# Patient Record
Sex: Female | Born: 1965 | Hispanic: No | Marital: Married | Smoking: Former smoker
Health system: Southern US, Community
[De-identification: ages and names within clinical notes are randomized; demographics above are authoritative.]

## PROBLEM LIST (undated history)

## (undated) HISTORY — PX: BREAST BIOPSY: SHX20

---

## 2009-09-13 ENCOUNTER — Ambulatory Visit: Payer: Self-pay | Admitting: Family Medicine

## 2014-11-13 ENCOUNTER — Emergency Department
Admission: EM | Admit: 2014-11-13 | Discharge: 2014-11-13 | Disposition: A | Discharge status: Home / Self Care | Payer: Medicare Other | Attending: Student | Admitting: Student

## 2014-11-13 ENCOUNTER — Encounter: Payer: Self-pay | Admitting: Emergency Medicine

## 2014-11-13 ENCOUNTER — Emergency Department: Payer: Medicare Other

## 2014-11-13 DIAGNOSIS — R112 Nausea with vomiting, unspecified: Secondary | ICD-10-CM | POA: Insufficient documentation

## 2014-11-13 DIAGNOSIS — Z3202 Encounter for pregnancy test, result negative: Secondary | ICD-10-CM | POA: Diagnosis not present

## 2014-11-13 DIAGNOSIS — N201 Calculus of ureter: Secondary | ICD-10-CM | POA: Diagnosis not present

## 2014-11-13 DIAGNOSIS — N200 Calculus of kidney: Secondary | ICD-10-CM | POA: Insufficient documentation

## 2014-11-13 DIAGNOSIS — R109 Unspecified abdominal pain: Secondary | ICD-10-CM | POA: Diagnosis not present

## 2014-11-13 DIAGNOSIS — D72829 Elevated white blood cell count, unspecified: Secondary | ICD-10-CM | POA: Diagnosis not present

## 2014-11-13 DIAGNOSIS — R1084 Generalized abdominal pain: Secondary | ICD-10-CM | POA: Diagnosis present

## 2014-11-13 DIAGNOSIS — R1111 Vomiting without nausea: Secondary | ICD-10-CM

## 2014-11-13 LAB — COMPREHENSIVE METABOLIC PANEL
ALBUMIN: 4.1 g/dL (ref 3.5–5.0)
ALT: 12 U/L — ABNORMAL LOW (ref 14–54)
AST: 18 U/L (ref 15–41)
Alkaline Phosphatase: 58 U/L (ref 38–126)
Anion gap: 9 (ref 5–15)
BILIRUBIN TOTAL: 0.6 mg/dL (ref 0.3–1.2)
BUN: 21 mg/dL — ABNORMAL HIGH (ref 6–20)
CHLORIDE: 111 mmol/L (ref 101–111)
CO2: 20 mmol/L — ABNORMAL LOW (ref 22–32)
CREATININE: 1.27 mg/dL — AB (ref 0.44–1.00)
Calcium: 9.2 mg/dL (ref 8.9–10.3)
GFR calc Af Amer: 58 mL/min — ABNORMAL LOW (ref >60)
GFR calc non Af Amer: 50 mL/min — ABNORMAL LOW (ref >60)
Glucose, Bld: 162 mg/dL — ABNORMAL HIGH (ref 65–99)
Potassium: 4.2 mmol/L (ref 3.5–5.1)
Sodium: 140 mmol/L (ref 135–145)
TOTAL PROTEIN: 7.1 g/dL (ref 6.5–8.1)

## 2014-11-13 LAB — URINALYSIS COMPLETE WITH MICROSCOPIC (ARMC ONLY)
Bilirubin Urine: NEGATIVE
GLUCOSE, UA: NEGATIVE mg/dL
NITRITE: NEGATIVE
PH: 6 (ref 5.0–8.0)
PROTEIN: NEGATIVE mg/dL
SPECIFIC GRAVITY, URINE: 1.025 (ref 1.005–1.030)

## 2014-11-13 LAB — CBC WITH DIFFERENTIAL/PLATELET
BASOS PCT: 0 %
Basophils Absolute: 0 10*3/uL (ref 0–0.1)
EOS ABS: 0 10*3/uL (ref 0–0.7)
Eosinophils Relative: 0 %
HCT: 41.8 % (ref 35.0–47.0)
HEMOGLOBIN: 13.7 g/dL (ref 12.0–16.0)
LYMPHS PCT: 8 %
Lymphs Abs: 1 10*3/uL (ref 1.0–3.6)
MCH: 28.9 pg (ref 26.0–34.0)
MCHC: 32.8 g/dL (ref 32.0–36.0)
MCV: 88.1 fL (ref 80.0–100.0)
MONO ABS: 0.6 10*3/uL (ref 0.2–0.9)
MONOS PCT: 4 %
NEUTROS ABS: 11.8 10*3/uL — AB (ref 1.4–6.5)
Neutrophils Relative %: 88 %
PLATELETS: 197 10*3/uL (ref 150–440)
RBC: 4.75 MIL/uL (ref 3.80–5.20)
RDW: 14 % (ref 11.5–14.5)
WBC: 13.4 10*3/uL — ABNORMAL HIGH (ref 3.6–11.0)

## 2014-11-13 LAB — LIPASE, BLOOD: LIPASE: 34 U/L (ref 22–51)

## 2014-11-13 LAB — TROPONIN I: Troponin I: <0.03 ng/mL (ref <0.031)

## 2014-11-13 LAB — HCG, QUANTITATIVE, PREGNANCY: hCG, Beta Chain, Quant, S: 1 m[IU]/mL (ref <5)

## 2014-11-13 MED ORDER — MORPHINE SULFATE 4 MG/ML IJ SOLN
4.0000 mg | Freq: Once | INTRAMUSCULAR | Status: AC
  Administered 2014-11-13: 4 mg via INTRAVENOUS

## 2014-11-13 MED ORDER — MORPHINE SULFATE 4 MG/ML IJ SOLN
INTRAMUSCULAR | Status: AC
  Administered 2014-11-13: 4 mg via INTRAVENOUS
  Filled 2014-11-13: qty 1

## 2014-11-13 MED ORDER — ONDANSETRON HCL 4 MG/2ML IJ SOLN
INTRAMUSCULAR | Status: AC
  Administered 2014-11-13: 4 mg via INTRAVENOUS
  Filled 2014-11-13: qty 2

## 2014-11-13 MED ORDER — MORPHINE SULFATE 4 MG/ML IJ SOLN
INTRAMUSCULAR | Status: AC
  Administered 2014-11-13: 4 mg
  Filled 2014-11-13: qty 1

## 2014-11-13 MED ORDER — ONDANSETRON HCL 4 MG PO TABS: 4.0000 mg | ORAL_TABLET | Freq: Every day | ORAL | Status: DC | PRN

## 2014-11-13 MED ORDER — SODIUM CHLORIDE 0.9 % IV BOLUS (SEPSIS)
500.0000 mL | Freq: Once | INTRAVENOUS | Status: AC
  Administered 2014-11-13: 500 mL via INTRAVENOUS

## 2014-11-13 MED ORDER — ONDANSETRON HCL 4 MG/2ML IJ SOLN
4.0000 mg | Freq: Once | INTRAMUSCULAR | Status: AC
  Administered 2014-11-13 (×2): 4 mg via INTRAVENOUS

## 2014-11-13 MED ORDER — CIPROFLOXACIN HCL 500 MG PO TABS: 500.0000 mg | ORAL_TABLET | Freq: Two times a day (BID) | ORAL | Status: DC

## 2014-11-13 MED ORDER — OXYCODONE-ACETAMINOPHEN 5-325 MG PO TABS: 1.0000 | ORAL_TABLET | Freq: Four times a day (QID) | ORAL | Status: DC | PRN

## 2014-11-13 MED ORDER — ONDANSETRON HCL 4 MG/2ML IJ SOLN: 4.0000 mg | Freq: Once | INTRAMUSCULAR | Status: AC

## 2014-11-13 MED ORDER — IOHEXOL 240 MG/ML SOLN
25.0000 mL | Freq: Once | INTRAMUSCULAR | Status: AC | PRN
  Administered 2014-11-13: 25 mL via ORAL

## 2014-11-13 MED ORDER — KETOROLAC TROMETHAMINE 30 MG/ML IJ SOLN
INTRAMUSCULAR | Status: AC
  Administered 2014-11-13: 30 mg
  Filled 2014-11-13: qty 1

## 2014-11-13 MED ORDER — IOHEXOL 300 MG/ML  SOLN
100.0000 mL | Freq: Once | INTRAMUSCULAR | Status: AC | PRN
  Administered 2014-11-13: 100 mL via INTRAVENOUS

## 2014-11-13 NOTE — ED Notes (Signed)
Patient transported to CT 

## 2014-11-13 NOTE — ED Provider Notes (Addendum)
Patient CT scan showed 5 m stone with mild hydronephrosis. Urology consulted.  ----------------------------------------- 9:49 AM on 11/13/2014 -----------------------------------------  Urology Dr. Alyson Ingles in the ED to see patient. He recommends discharge with Bactrim and to return if any fever or uncontrolled pain.  ----------------------------------------- 9:56 AM on 11/13/2014 -----------------------------------------  Patient is allergic to sulfa antibiotics we will substitute Cipro.  Lavonia Drafts, MD 11/13/14 [id number]  Lavonia Drafts, MD 11/13/14 [place], MD 11/13/14 [phone]

## 2014-11-13 NOTE — Discharge Instructions (Signed)

## 2014-11-13 NOTE — ED Provider Notes (Signed)
[facility] Department Provider Note  ____________________________________________  Time seen: Approximately 5:22 AM  I have reviewed the triage vital signs and the nursing notes.   HISTORY  Chief Complaint Abdominal Pain    HPI Leslie Chavez is a 49 y.o. female with no chronic medical problems presents for evaluation of sudden onset diffuse lower abdominal pain which began at approximately 11 PM last night and has been constant since, worsening or she is unable to describe the nature of the pain. She reports that she has had 3 episodes of nonbloody nonbilious emesis. She has been constipated, no diarrhea. No dysuria but she reports she had a similar but less severe pain in the setting of urinary tract infection in the past. No fevers. No chest pain or difficulty breathing. Currently her pain very is 10 out of 10. No modifying factors.   History reviewed. No pertinent past medical history.  There are no active problems to display for this patient.   History reviewed. No pertinent past surgical history.  No current outpatient prescriptions on file.  Allergies Sulfa antibiotics  History reviewed. No pertinent family history.  Social History History  Substance Use Topics  . Smoking status: Never Smoker   . Smokeless tobacco: Not on file  . Alcohol Use: No    Review of Systems Constitutional: No fever/chills Eyes: No visual changes. ENT: No sore throat. Cardiovascular: Denies chest pain. Respiratory: Denies shortness of breath. Gastrointestinal: + abdominal pain.  + nausea, + vomiting.  No diarrhea.  No constipation. Genitourinary: Negative for dysuria. Musculoskeletal: Negative for back pain. Skin: Negative for rash. Neurological: Negative for headaches, focal weakness or numbness.  10-point ROS otherwise negative.  ____________________________________________   PHYSICAL EXAM:  VITAL SIGNS: ED Triage Vitals  Enc Vitals  Group     BP --      Pulse --      Resp --      Temp --      Temp src --      SpO2 --      Weight --      Height --      Head Cir --      Peak Flow --      Pain Score --      Pain Loc --      Pain Edu? --      Excl. in [place]? --     Constitutional: Alert and oriented. She is screaming in pain intermittently however this completely resolves with distraction. Eyes: Conjunctivae are normal. PERRL. EOMI. Head: Atraumatic. Nose: No congestion/rhinnorhea. Mouth/Throat: Mucous membranes are moist.  Oropharynx non-erythematous. Neck: No stridor.   Cardiovascular: Normal rate, regular rhythm. Grossly normal heart sounds.  Good peripheral circulation. Respiratory: Normal respiratory effort.  No retractions. Lungs CTAB. Gastrointestinal: Soft with mild diffuse tenderness. No distention. No abdominal bruits. No CVA tenderness. Genitourinary: deferred Musculoskeletal: No lower extremity tenderness nor edema.  No joint effusions. Neurologic:  Normal speech and language. No gross focal neurologic deficits are appreciated. Speech is normal. No gait instability. Skin:  Skin is warm, dry and intact. No rash noted. Psychiatric: Mood and affect are normal. Speech and behavior are normal.  ____________________________________________   LABS (all labs ordered are listed, but only abnormal results are displayed)  Labs Reviewed  CBC WITH DIFFERENTIAL/PLATELET  COMPREHENSIVE METABOLIC PANEL  LIPASE, BLOOD  HCG, QUANTITATIVE, PREGNANCY  URINALYSIS COMPLETEWITH MICROSCOPIC ([place])   ____________________________________________  EKG  ED ECG REPORT I, Joanne Gavel, the attending  physician, personally viewed and interpreted this ECG.   Date: 11/13/2014  EKG Time:05:25  Rate: 76  Rhythm: normal EKG, normal sinus rhythm  Axis: normal  Intervals:none  ST&T Change: No acute ST segment changes  ____________________________________________  RADIOLOGY  CT abdomen and pelvis  pending ____________________________________________   PROCEDURES  Procedure(s) performed: None  Critical Care performed: No  ____________________________________________   INITIAL IMPRESSION / ASSESSMENT AND PLAN / ED COURSE  Pertinent labs & imaging results that were available during my care of the patient were reviewed by me and considered in my medical decision making (see chart for details).  Leslie Chavez is a 49 y.o. female with no chronic medical problems presents for evaluation of sudden onset diffuse lower abdominal pain and vomiting. On exam, she appears to be in mild distress intermittently but this Zoll's with distraction. Vital signs stable. She is afebrile. She has mild tenderness to palpation diffusely, worse in the suprapubic region. Plan for screening labs, pain control, IV fluids, frequent reassessments.  ----------------------------------------- 6:18 AM on 11/13/2014 -----------------------------------------  Patient reports some improvement in her pain. Labs notable for leukocytosis and given her diffuse pain, severe distress on arrival, will obtain CT of the abdomen and pelvis to further evaluate the cause of her pain.  ----------------------------------------- 6:51 AM on 11/13/2014 -----------------------------------------  Care transferred to Dr. Corky Downs pending urinalysis, CT of the abdomen and pelvis, reassessment.   ____________________________________________   FINAL CLINICAL IMPRESSION(S) / ED DIAGNOSES  Final diagnoses:  Abdominal pain, unspecified abdominal location  Non-intractable vomiting without nausea, vomiting of unspecified type      Joanne Gavel, MD 11/13/14 [phone]

## 2014-11-13 NOTE — ED Notes (Signed)
Patient presents to Emergency Department via EMS with complaints of suprapubic pain that started after pt ate a piece of cake and then vomited.

## 2014-11-13 NOTE — ED Notes (Signed)
Pt drinking contrast in bed

## 2014-11-13 NOTE — Consult Note (Signed)
Urology Consult  Referring physician: Dr. Corky Downs Reason for referral: ureteral stone  Chief Complaint: right flank pain  History of Present Illness: Ms Leslie Chavez is a 49yo who presented to the ER with a 1 day hx of severe, sharp, intermitent, non radiating right flank pain. This is her first stone event. She received toradol which has improved her pain. She had nausea and vomiting but that has resolved. She denies any LUTS. She denies any fever, chills, sweats. Ct scan shows a 61m proximal ureteral stone with moderate hydronephrosis. WBC count is 13.4. UA shows leukocystes, no bacteria  History reviewed. No pertinent past medical history. History reviewed. No pertinent past surgical history.  Medications: I have reviewed the patient's current medications. Allergies:  Allergies  Allergen Reactions  . Sulfa Antibiotics Hives    History reviewed. No pertinent family history. Social History:  reports that she has never smoked. She does not have any smokeless tobacco history on file. She reports that she does not drink alcohol. Her drug history is not on file.  Review of Systems  Gastrointestinal: Positive for nausea and vomiting.  Genitourinary: Positive for flank pain. Negative for dysuria, urgency, frequency and hematuria.  All other systems reviewed and are negative.   Physical Exam:  Vital signs in last 24 hours: Temp:  [98 F (36.7 C)] 98 F (36.7 C) (07/03 0521) Pulse Rate:  [67-83] 68 (07/03 1006) Resp:  [13-24] 14 (07/03 1006) BP: (110-151)/(57-102) 112/62 mmHg (07/03 1006) SpO2:  [93 %-99 %] 98 % (07/03 1006) Weight:  [90.266 kg (199 lb)] 90.266 kg (199 lb) (07/03 0521) Physical Exam  Constitutional: She is oriented to person, place, and time. She appears well-developed and well-nourished. No distress.  HENT:  Head: Normocephalic and atraumatic.  Eyes: EOM are normal. Pupils are equal, round, and reactive to light.  Neck: Normal range of motion. Neck supple. No  thyromegaly present.  Respiratory: Effort normal and breath sounds normal.  GI: Soft. She exhibits no distension. There is no tenderness.  Musculoskeletal: Normal range of motion.  Neurological: She is alert and oriented to person, place, and time.  Skin: Skin is warm and dry.  Psychiatric: She has a normal mood and affect. Judgment and thought content normal.    Laboratory Data:  Results for orders placed or performed during the hospital encounter of 11/13/14 (from the past 72 hour(s))  CBC with Differential     Status: Abnormal   Collection Time: 11/13/14  5:41 AM  Result Value Ref Range   WBC 13.4 (H) 3.6 - 11.0 K/uL   RBC 4.75 3.80 - 5.20 MIL/uL   Hemoglobin 13.7 12.0 - 16.0 g/dL   HCT 41.8 35.0 - 47.0 %   MCV 88.1 80.0 - 100.0 fL   MCH 28.9 26.0 - 34.0 pg   MCHC 32.8 32.0 - 36.0 g/dL   RDW 14.0 11.5 - 14.5 %   Platelets 197 150 - 440 K/uL   Neutrophils Relative % 88 %   Neutro Abs 11.8 (H) 1.4 - 6.5 K/uL   Lymphocytes Relative 8 %   Lymphs Abs 1.0 1.0 - 3.6 K/uL   Monocytes Relative 4 %   Monocytes Absolute 0.6 0.2 - 0.9 K/uL   Eosinophils Relative 0 %   Eosinophils Absolute 0.0 0 - 0.7 K/uL   Basophils Relative 0 %   Basophils Absolute 0.0 0 - 0.1 K/uL  Comprehensive metabolic panel     Status: Abnormal   Collection Time: 11/13/14  5:41 AM  Result Value Ref  Range   Sodium 140 135 - 145 mmol/L   Potassium 4.2 3.5 - 5.1 mmol/L   Chloride 111 101 - 111 mmol/L   CO2 20 (L) 22 - 32 mmol/L   Glucose, Bld 162 (H) 65 - 99 mg/dL   BUN 21 (H) 6 - 20 mg/dL   Creatinine, Ser 1.27 (H) 0.44 - 1.00 mg/dL   Calcium 9.2 8.9 - 10.3 mg/dL   Total Protein 7.1 6.5 - 8.1 g/dL   Albumin 4.1 3.5 - 5.0 g/dL   AST 18 15 - 41 U/L   ALT 12 (L) 14 - 54 U/L   Alkaline Phosphatase 58 38 - 126 U/L   Total Bilirubin 0.6 0.3 - 1.2 mg/dL   GFR calc non Af Amer 50 (L) >60 mL/min   GFR calc Af Amer 58 (L) >60 mL/min    Comment: (NOTE) The eGFR has been calculated using the CKD EPI  equation. This calculation has not been validated in all clinical situations. eGFR's persistently <60 mL/min signify possible Chronic Kidney Disease.    Anion gap 9 5 - 15  Lipase, blood     Status: None   Collection Time: 11/13/14  5:41 AM  Result Value Ref Range   Lipase 34 22 - 51 U/L  hCG, quantitative, pregnancy     Status: None   Collection Time: 11/13/14  5:41 AM  Result Value Ref Range   hCG, Beta Chain, Quant, S 1 <5 mIU/mL    Comment:          GEST. AGE      CONC.  (mIU/mL)   <=1 WEEK        5 - 50     2 WEEKS       50 - 500     3 WEEKS       100 - 10,000     4 WEEKS     1,000 - 30,000     5 WEEKS     3,500 - 115,000   6-8 WEEKS     12,000 - 270,000    12 WEEKS     15,000 - 220,000        FEMALE AND NON-PREGNANT FEMALE:     LESS THAN 5 mIU/mL   Troponin I     Status: None   Collection Time: 11/13/14  5:46 AM  Result Value Ref Range   Troponin I <0.03 <0.031 ng/mL    Comment:        NO INDICATION OF MYOCARDIAL INJURY.   Urinalysis complete, with microscopic ([facility] only)     Status: Abnormal   Collection Time: 11/13/14  6:30 AM  Result Value Ref Range   Color, Urine YELLOW (A) YELLOW   APPearance HAZY (A) CLEAR   Glucose, UA NEGATIVE NEGATIVE mg/dL   Bilirubin Urine NEGATIVE NEGATIVE   Ketones, ur 1+ (A) NEGATIVE mg/dL   Specific Gravity, Urine 1.025 1.005 - 1.030   Hgb urine dipstick 3+ (A) NEGATIVE   pH 6.0 5.0 - 8.0   Protein, ur NEGATIVE NEGATIVE mg/dL   Nitrite NEGATIVE NEGATIVE   Leukocytes, UA 2+ (A) NEGATIVE   RBC / HPF 6-30 0 - 5 RBC/hpf   WBC, UA 6-30 0 - 5 WBC/hpf   Bacteria, UA RARE (A) NONE SEEN   Squamous Epithelial / LPF 0-5 (A) NONE SEEN   Mucous PRESENT    No results found for this or any previous visit (from the past 240 hour(s)). Creatinine:  Recent Labs  11/13/14  0541  CREATININE 1.27*    Impression/Assessment:  Right ureteral stone  Plan:  We discussed management strategies of ureteral stones including medical  expulsive  therapy (MET), ureteroscopic stone manipulation  (URS), and shockwave lithotripsy (SWL) in detail including relative risks / benefits / and  efficacy. We discussed that all patients are candidates for MET as long as can keep  comfortable and hydrated. After consideration of options, the patient has decided to  proceed with MET. Recommend rx for flomax, antiemetic, and narcotic. Followup 1 week with [facility], [place] 11/13/2014, 8:35 PM

## 2014-11-17 ENCOUNTER — Ambulatory Visit (INDEPENDENT_AMBULATORY_CARE_PROVIDER_SITE_OTHER): Payer: Medicare Other | Admitting: Urology

## 2014-11-17 ENCOUNTER — Encounter: Payer: Self-pay | Admitting: Urology

## 2014-11-17 VITALS — BP 122/79 | HR 102 | Ht 61.0 in | Wt 194.0 lb

## 2014-11-17 DIAGNOSIS — N1330 Unspecified hydronephrosis: Secondary | ICD-10-CM | POA: Diagnosis not present

## 2014-11-17 DIAGNOSIS — R109 Unspecified abdominal pain: Secondary | ICD-10-CM | POA: Diagnosis not present

## 2014-11-17 DIAGNOSIS — N200 Calculus of kidney: Secondary | ICD-10-CM | POA: Diagnosis not present

## 2014-11-17 LAB — URINALYSIS, COMPLETE
BILIRUBIN UA: NEGATIVE
Glucose, UA: NEGATIVE
Nitrite, UA: NEGATIVE
PH UA: 5 (ref 5.0–7.5)
Protein, UA: NEGATIVE
Specific Gravity, UA: >1.03 — ABNORMAL HIGH (ref 1.005–1.030)
UUROB: 0.2 mg/dL (ref 0.2–1.0)

## 2014-11-17 LAB — MICROSCOPIC EXAMINATION

## 2014-11-17 MED ORDER — DOCUSATE SODIUM 100 MG PO CAPS: 100.0000 mg | ORAL_CAPSULE | Freq: Two times a day (BID) | ORAL | Status: DC

## 2014-11-17 MED ORDER — OXYCODONE-ACETAMINOPHEN 5-325 MG PO TABS: 1.0000 | ORAL_TABLET | Freq: Four times a day (QID) | ORAL | Status: AC | PRN

## 2014-11-17 NOTE — Progress Notes (Signed)
11/17/2014 11:03 AM   Leslie Chavez 08-May-1966 [id number]  Referring provider: Donnie Coffin, MD [place], [place] [address]  Chief Complaint  Patient presents with  . Nephrolithiasis    pt was seen in ER x 4 days ago for kidney stones. CT scan showed a 5 mm proximal right ureteral stone with mild right hydronephrosis.    HPI: 49 year old female in and evaluated in the emergency room on 11/13/2014 for acute onset right flank pain have a 5 mm right proximal ureteral stone with associated mild right hydronephrosis and perinephric stranding.  She was noted to have a slightly elevated Cr to 1.27 (unknown baseline), WBC 13.4, and UA with leukocystes, no bacteria.    In the ER, her pain was able to be controlled with Toradol and she was discharged with Flomax.  She was seen and evaluated by urology prior to discharge (Dr. Alyson Ingles).     She returns to our office today for follow up.  She continues to have mild intermittent right flank pain as recently as early this morning. Her pain is able to be controlled with oral medications. No nausea, vomiting, fevers, or chills.  No previous history of kidney stones although she does have a family history.   PMH: No past medical history on file.  Surgical History: No past surgical history on file.  Home Medications:    Medication List       This list is accurate as of: 11/17/14 11:59 PM.  Always use your most recent med list.               ciprofloxacin 500 MG tablet  Commonly known as:  CIPRO  Take 1 tablet (500 mg total) by mouth 2 (two) times daily.     docusate sodium 100 MG capsule  Commonly known as:  COLACE  Take 1 capsule (100 mg total) by mouth 2 (two) times daily.     medroxyPROGESTERone 150 MG/ML injection  Commonly known as:  DEPO-PROVERA  Inject 150 mg into the muscle every 3 (three) months.     ondansetron 4 MG tablet  Commonly known as:  ZOFRAN  Take 1 tablet (4 mg total) by mouth daily as  needed for nausea or vomiting.     oxyCODONE-acetaminophen 5-325 MG per tablet  Commonly known as:  ROXICET  Take 1 tablet by mouth every 6 (six) hours as needed.        Allergies:  Allergies  Allergen Reactions  . Sulfa Antibiotics Hives    Family History: Family History  Problem Relation Age of Onset  . Nephrolithiasis Sister     Social History:  reports that she has quit smoking. She does not have any smokeless tobacco history on file. She reports that she drinks about 0.6 oz of alcohol per week. She reports that she does not use illicit drugs.  Review of Systems UROLOGY Frequent Urination?: Yes Hard to postpone urination?: Yes Burning/pain with urination?: No Get up at night to urinate?: No Leakage of urine?: No Urine stream starts and stops?: No Trouble starting stream?: No Do you have to strain to urinate?: No Blood in urine?: No Urinary tract infection?: Yes Sexually transmitted disease?: No Injury to kidneys or bladder?: No Painful intercourse?: No Weak stream?: No Currently pregnant?: No Vaginal bleeding?: No Last menstrual period?: n Gastrointestinal Nausea?: No Vomiting?: No Indigestion/heartburn?: No Diarrhea?: No Constipation?: Yes Constitutional Fever: No Night sweats?: No Weight loss?: No Fatigue?: No Skin Skin rash/lesions?: No Itching?: No  Eyes Blurred vision?: No Double vision?: No Ears/Nose/Throat Sore throat?: No Sinus problems?: No Hematologic/Lymphatic Swollen glands?: No Easy bruising?: No Cardiovascular Leg swelling?: No Chest pain?: No Respiratory Cough?: No Shortness of breath?: No Endocrine Excessive thirst?: No Musculoskeletal Back pain?: No Joint pain?: No Neurological Headaches?: No Dizziness?: No Psychologic Depression?: No Anxiety?: No   Physical Exam: BP 122/79 mmHg  Pulse 102  Ht 5\' 1"  (1.549 m)  Wt 194 lb (87.998 kg)  BMI 36.67 kg/m2  Constitutional:  Alert and oriented, No acute  distress. HEENT: [place] AT, moist mucus membranes.  Trachea midline, no masses. Cardiovascular: No clubbing, cyanosis, or edema. Respiratory: Normal respiratory effort, no increased work of breathing. GI: Abdomen is soft, nontender, nondistended, no abdominal masses GU: No CVA tenderness.  Skin: No rashes, bruises or suspicious lesions. Lymph: No cervical or inguinal adenopathy. Neurologic: Grossly intact, no focal deficits, moving all 4 extremities. Psychiatric: Normal mood and affect.  Laboratory Data: Lab Results  Component Value Date   WBC 13.4* 11/13/2014   HGB 13.7 11/13/2014   HCT 41.8 11/13/2014   MCV 88.1 11/13/2014   PLT 197 11/13/2014    Lab Results  Component Value Date   CREATININE 1.27* 11/13/2014     Urinalysis Results for Leslie Chavez, Leslie Chavez (MRN [id number]) as of 11/17/2014 11:15  Ref. Range 11/13/2014 06:30  Appearance Latest Ref Range: CLEAR  HAZY (A)  Bacteria, UA Latest Ref Range: NONE SEEN  RARE (A)  Bilirubin Urine Latest Ref Range: NEGATIVE  NEGATIVE  Color, Urine Latest Ref Range: YELLOW  YELLOW (A)  Glucose Latest Ref Range: NEGATIVE mg/dL NEGATIVE  Hgb urine dipstick Latest Ref Range: NEGATIVE  3+ (A)  Ketones, ur Latest Ref Range: NEGATIVE mg/dL 1+ (A)  Leukocytes, UA Latest Ref Range: NEGATIVE  2+ (A)  Mucous Unknown PRESENT  Nitrite Latest Ref Range: NEGATIVE  NEGATIVE  pH Latest Ref Range: 5.0-8.0  6.0  Protein Latest Ref Range: NEGATIVE mg/dL NEGATIVE  RBC / HPF Latest Ref Range: 0-5 RBC/hpf 6-30  Specific Gravity, Urine Latest Ref Range: 1.005-1.030  1.025  Squamous Epithelial / LPF Latest Ref Range: NONE SEEN  0-5 (A)  WBC, UA Latest Ref Range: 0-5 WBC/hpf 6-30   Results for orders placed or performed in visit on 11/17/14  Microscopic Examination  Result Value Ref Range   WBC, UA 6-10 (A) 0 -  5 /hpf   RBC, UA 3-10 (A) 0 -  2 /hpf   Epithelial Cells (non renal) 0-10 0 - 10 /hpf   Bacteria, UA Moderate (A) None seen/Few  Urine culture   Result Value Ref Range   Urine Culture, Routine Final report    Result 1 No growth   Urinalysis, Complete  Result Value Ref Range   Specific Gravity, UA >1.030 (H) 1.005 - 1.030   pH, UA 5.0 5.0 - 7.5   Color, UA Yellow Yellow   Appearance Ur Hazy (A) Clear   Leukocytes, UA 1+ (A) Negative   Protein, UA Negative Negative/Trace   Glucose, UA Negative Negative   Ketones, UA 2+ (A) Negative   RBC, UA 1+ (A) Negative   Bilirubin, UA Negative Negative   Urobilinogen, Ur 0.2 0.2 - 1.0 mg/dL   Nitrite, UA Negative Negative   Microscopic Examination See below:      Pertinent Imaging: CLINICAL DATA: Periumbilical pain since 11 p.m. last night. Vomiting.  EXAM: CT ABDOMEN AND PELVIS WITH CONTRAST  TECHNIQUE: Multidetector CT imaging of the abdomen and pelvis was performed using the standard  protocol following bolus administration of intravenous contrast.  CONTRAST: 140mL OMNIPAQUE IOHEXOL 300 MG/ML SOLN  COMPARISON: None.  FINDINGS: Lung bases are clear. No effusions. Heart is normal size.  5 mm proximal right ureteral stone with mild right hydronephrosis and moderate perinephric stranding. Delayed excretion of contrast from the right kidney. Left kidney is unremarkable. Liver, gallbladder, spleen, pancreas, adrenals are unremarkable.  Stomach, large and small bowel are unremarkable. Appendix is visualized and is normal. Uterus, adnexae and urinary bladder are unremarkable. No acute bony abnormality. Degenerative disc disease at L5-S1.  IMPRESSION: 5 mm proximal right ureteral stone with mild right hydronephrosis and perinephric stranding.   Electronically Signed  By: Rolm Baptise M.D.  On: 11/13/2014 08:05  Assessment & Plan:  49 yo F with 5 mm R obstructing ureteral stone currently doing well on medical expulsive therapy.  We discussed her options moving forward including continued medical expulsive therapy, ESWL, and ureteroscopy. We discussed  the risks and benefits of each. Given the size of her stone, I do feel that she has a good chance of passing it. She would like to continue to proceed with conservative management and will return in 2 weeks to be reassessed with KUB prior.  The interim, she was advised to present emergently to the ED if she develops any severe pain not able to be controlled with narcotics, nausea vomiting, and/or fevers or chills.  1. Nephrolithiasis - Urinalysis, Complete - DG Abd 1 View; Future (2 weeks) - Urine culture  2. Hydronephrosis, right   3. Right flank pain    Return in about 2 weeks (around 12/01/2014) for KUB prior .  [name], MD  [facility] [address], [place], [place] [address] [phone]

## 2014-11-19 LAB — URINE CULTURE: ORGANISM ID, BACTERIA: NO GROWTH

## 2014-12-01 ENCOUNTER — Ambulatory Visit
Admission: RE | Admit: 2014-12-01 | Discharge: 2014-12-01 | Disposition: A | Discharge status: Home / Self Care | Payer: Medicare Other | Source: Ambulatory Visit | Attending: Urology | Admitting: Urology

## 2014-12-01 DIAGNOSIS — N200 Calculus of kidney: Secondary | ICD-10-CM | POA: Diagnosis present

## 2014-12-06 ENCOUNTER — Encounter: Payer: Self-pay | Admitting: Urology

## 2014-12-06 ENCOUNTER — Ambulatory Visit (INDEPENDENT_AMBULATORY_CARE_PROVIDER_SITE_OTHER): Payer: Medicare Other | Admitting: Urology

## 2014-12-06 VITALS — BP 112/77 | HR 97 | Ht 61.0 in | Wt 190.5 lb

## 2014-12-06 DIAGNOSIS — N1330 Unspecified hydronephrosis: Secondary | ICD-10-CM

## 2014-12-06 DIAGNOSIS — R109 Unspecified abdominal pain: Secondary | ICD-10-CM

## 2014-12-06 DIAGNOSIS — N200 Calculus of kidney: Secondary | ICD-10-CM | POA: Diagnosis not present

## 2014-12-06 LAB — URINALYSIS, COMPLETE
BILIRUBIN UA: NEGATIVE
GLUCOSE, UA: NEGATIVE
Ketones, UA: NEGATIVE
Nitrite, UA: NEGATIVE
Protein, UA: NEGATIVE
Specific Gravity, UA: 1.02 (ref 1.005–1.030)
UUROB: 0.2 mg/dL (ref 0.2–1.0)
pH, UA: 6 (ref 5.0–7.5)

## 2014-12-06 LAB — MICROSCOPIC EXAMINATION

## 2014-12-06 NOTE — Patient Instructions (Signed)
Dietary Guidelines to Help Prevent Kidney Stones  Your risk of kidney stones can be decreased by adjusting the foods you eat. The most important thing you can do is drink enough fluid. You should drink enough fluid to keep your urine clear or pale yellow. The following guidelines provide specific information for the type of kidney stone you have had.  GUIDELINES ACCORDING TO TYPE OF KIDNEY STONE  Calcium Oxalate Kidney Stones  · Reduce the amount of salt you eat. Foods that have a lot of salt cause your body to release excess calcium into your urine. The excess calcium can combine with a substance called oxalate to form kidney stones.  · Reduce the amount of animal protein you eat if the amount you eat is excessive. Animal protein causes your body to release excess calcium into your urine. Ask your dietitian how much protein from animal sources you should be eating.  · Avoid foods that are high in oxalates. If you take vitamins, they should have less than 500 mg of vitamin C. Your body turns vitamin C into oxalates. You do not need to avoid fruits and vegetables high in vitamin C.  Calcium Phosphate Kidney Stones  · Reduce the amount of salt you eat to help prevent the release of excess calcium into your urine.  · Reduce the amount of animal protein you eat if the amount you eat is excessive. Animal protein causes your body to release excess calcium into your urine. Ask your dietitian how much protein from animal sources you should be eating.  · Get enough calcium from food or take a calcium supplement (ask your dietitian for recommendations). Food sources of calcium that do not increase your risk of kidney stones include:  ¨ Broccoli.  ¨ Dairy products, such as cheese and yogurt.  ¨ Pudding.  Uric Acid Kidney Stones  · Do not have more than 6 oz of animal protein per day.  FOOD SOURCES  Animal Protein Sources  · Meat (all types).  · Poultry.  · Eggs.  · Fish, seafood.  Foods High in Salt  · Salt seasonings.  · Soy  sauce.  · Teriyaki sauce.  · Cured and processed meats.  · Salted crackers and snack foods.  · Fast food.  · Canned soups and most canned foods.  Foods High in Oxalates  · Grains:  ¨ Amaranth.  ¨ Barley.  ¨ Grits.  ¨ Wheat germ.  ¨ Bran.  ¨ Buckwheat flour.  ¨ All bran cereals.  ¨ Pretzels.  ¨ Whole wheat bread.  · Vegetables:  ¨ Beans (wax).  ¨ Beets and beet greens.  ¨ Collard greens.  ¨ Eggplant.  ¨ Escarole.  ¨ Leeks.  ¨ Okra.  ¨ Parsley.  ¨ Rutabagas.  ¨ Spinach.  ¨ Swiss chard.  ¨ Tomato paste.  ¨ Fried potatoes.  ¨ Sweet potatoes.  · Fruits:  ¨ Red currants.  ¨ Figs.  ¨ Kiwi.  ¨ Rhubarb.  · Meat and Other Protein Sources:  ¨ Beans (dried).  ¨ Soy burgers and other soybean products.  ¨ Miso.  ¨ Nuts (peanuts, almonds, pecans, cashews, hazelnuts).  ¨ Nut butters.  ¨ Sesame seeds and tahini (paste made of sesame seeds).  ¨ Poppy seeds.  · Beverages:  ¨ Chocolate drink mixes.  ¨ Soy milk.  ¨ Instant iced tea.  ¨ Juices made from high-oxalate fruits or vegetables.  · Other:  ¨ Carob.  ¨ Chocolate.  ¨ Fruitcake.  ¨ Marmalades.  Document Released:   08/24/2010 Document Revised: 05/04/2013 Document Reviewed: 03/26/2013  ExitCare® Patient Information ©2015 [employer]. This information is not intended to replace advice given to you by your health care provider. Make sure you discuss any questions you have with your health care provider.

## 2014-12-06 NOTE — Progress Notes (Signed)
2:19 PM   Leslie Chavez 07-05-1965 [id number]  Referring provider: No referring provider defined for this encounter.  Chief Complaint  Patient presents with  . Nephrolithiasis    2wk with KUB    HPI: 49 year old female in and evaluated in the emergency room on 11/13/2014 for acute onset right flank pain have a 5 mm right proximal ureteral stone with associated mild right hydronephrosis and perinephric stranding.  She was noted to have a slightly elevated Cr to 1.27 (unknown baseline), WBC 13.4, and UA with leukocystes, no bacteria.    In the ER, her pain was able to be controlled with Toradol and she was discharged with Flomax.  She was seen and evaluated by urology prior to discharge (Dr. Alyson Ingles).     At last office visit, she was still having some intermittent flank pain. She returns today for follow-up with a KUB prior. KUB on 12/01/2014 shows no obvious residual stone. She has not had any flank pain since her last visit but has also not seen the stone pass. She denies any urinary symptoms today. No fevers or chills.  No previous history of kidney stones although she does have a family history.   PMH: No past medical history on file.  Surgical History: No past surgical history on file.  Home Medications:    Medication List       This list is accurate as of: 12/06/14  2:19 PM.  Always use your most recent med list.               docusate sodium 100 MG capsule  Commonly known as:  COLACE  Take 1 capsule (100 mg total) by mouth 2 (two) times daily.     medroxyPROGESTERone 150 MG/ML injection  Commonly known as:  DEPO-PROVERA  Inject 150 mg into the muscle every 3 (three) months.     oxyCODONE-acetaminophen 5-325 MG per tablet  Commonly known as:  ROXICET  Take 1 tablet by mouth every 6 (six) hours as needed.        Allergies:  Allergies  Allergen Reactions  . Sulfa Antibiotics Hives    Family History: Family History  Problem Relation Age of Onset   . Nephrolithiasis Sister     Social History:  reports that she has quit smoking. She does not have any smokeless tobacco history on file. She reports that she drinks about 0.6 oz of alcohol per week. She reports that she does not use illicit drugs.  Review of Systems UROLOGY Frequent Urination?: No Hard to postpone urination?: No Burning/pain with urination?: No Get up at night to urinate?: No Leakage of urine?: No Urine stream starts and stops?: No Trouble starting stream?: No Do you have to strain to urinate?: No Blood in urine?: No Urinary tract infection?: No Sexually transmitted disease?: No Injury to kidneys or bladder?: No Painful intercourse?: No Weak stream?: No Currently pregnant?: No Vaginal bleeding?: No Last menstrual period?: n Gastrointestinal Nausea?: No Vomiting?: No Indigestion/heartburn?: No Diarrhea?: No Constipation?: No Constitutional Fever: No Night sweats?: No Weight loss?: No Fatigue?: No Skin Skin rash/lesions?: No Itching?: No Eyes Blurred vision?: No Double vision?: No Ears/Nose/Throat Sore throat?: No Sinus problems?: No Hematologic/Lymphatic Swollen glands?: No Easy bruising?: No Cardiovascular Leg swelling?: No Chest pain?: No Respiratory Cough?: No Shortness of breath?: No Endocrine Excessive thirst?: No Musculoskeletal Back pain?: No Joint pain?: No Neurological Headaches?: No Dizziness?: No Psychologic Depression?: No Anxiety?: No   Physical Exam: BP 112/77 mmHg  Pulse 97  Ht 5\' 1"  (1.549 m)  Wt 190 lb 8 oz (86.41 kg)  BMI 36.01 kg/m2  Constitutional:  Alert and oriented, No acute distress.  Presents to clinic today with a caretaker. HEENT: [place] AT, moist mucus membranes.  Trachea midline, no masses.  Poor dentition. Cardiovascular: No clubbing, cyanosis, or edema. Respiratory: Normal respiratory effort, no increased work of breathing. GI: Abdomen is soft, nontender, nondistended, no abdominal masses GU: No  CVA tenderness.  Skin: No rashes, bruises or suspicious lesions. Neurologic: Grossly intact, no focal deficits, moving all 4 extremities. Psychiatric: Normal mood and affect.  Laboratory Data: Lab Results  Component Value Date   WBC 13.4* 11/13/2014   HGB 13.7 11/13/2014   HCT 41.8 11/13/2014   MCV 88.1 11/13/2014   PLT 197 11/13/2014    Lab Results  Component Value Date   CREATININE 1.27* 11/13/2014     Urinalysis    Pertinent Imaging: CLINICAL DATA: History kidney stones. No current symptoms.  EXAM: ABDOMEN - 1 VIEW  COMPARISON: CT abdomen pelvis - 11/13/2014  FINDINGS: There is increased sclerosis involving in the lateral most aspect of the right L3 transverse process.  No definitive abnormal opacities overlie the expected location of either renal fossa, ureter or the urinary bladder. Several phleboliths are seen within the pelvis bilaterally.  Paucity of bowel gas without definite evidence of obstruction.  A bone island since filling noted within the right femoral neck. No acute osseus abnormalities.  IMPRESSION: No definite evidence of nephrolithiasis. Further evaluation could be performed with noncontrast abdominal CT as clinically indicated.   Electronically Signed  By: Sandi Mariscal M.D.  On: 12/01/2014 13:13  Assessment & Plan:  49 yo F with 5 mm R obstructing ureteral stone on CT scan on 11/13/2014 who is since been managed with medical expulsive therapy. She's not seen the stone passed but no longer has flank pain. No stone and follow-up KUB.    1. Nephrolithiasis We discussed various treatment options including ESWL vs. ureteroscopy, laser lithotripsy, and stent. We discussed the risks and benefits of both including bleeding, infection, damage to surrounding structures, efficacy with need for possible further intervention, and need for temporary ureteral stent.  2. Hydronephrosis, right Recommend follow up renal ultrasound to ensure  resolution of hydro- will call with results  3. Right flank pain Resolved   Return for will call with results of RUS .  [name], MD  [facility] [address], [place], [place] [address] [phone]

## 2014-12-13 ENCOUNTER — Ambulatory Visit: Payer: Medicare Other

## 2014-12-21 ENCOUNTER — Ambulatory Visit
Admission: RE | Admit: 2014-12-21 | Discharge: 2014-12-21 | Disposition: A | Discharge status: Home / Self Care | Payer: Medicare Other | Source: Ambulatory Visit | Attending: Urology | Admitting: Urology

## 2014-12-21 DIAGNOSIS — N1330 Unspecified hydronephrosis: Secondary | ICD-10-CM | POA: Diagnosis not present

## 2014-12-21 DIAGNOSIS — N200 Calculus of kidney: Secondary | ICD-10-CM

## 2014-12-23 ENCOUNTER — Telehealth: Payer: Self-pay

## 2014-12-23 NOTE — Telephone Encounter (Signed)
Spoke with pt in reference to u/s results. Pt voiced understanding.

## 2014-12-23 NOTE — Telephone Encounter (Signed)
-----   Message from [name], MD sent at 12/22/2014  4:24 PM EDT ----- Please let this patient know her ultrasound looked perfect.  No stones or swelling.   She can follow up on an as needed basis.  [name], MD

## 2015-01-31 ENCOUNTER — Other Ambulatory Visit: Payer: Self-pay | Admitting: Family Medicine

## 2015-01-31 DIAGNOSIS — Z1231 Encounter for screening mammogram for malignant neoplasm of breast: Secondary | ICD-10-CM

## 2015-02-07 ENCOUNTER — Ambulatory Visit
Admission: RE | Admit: 2015-02-07 | Discharge: 2015-02-07 | Disposition: A | Discharge status: Home / Self Care | Payer: Medicare Other | Source: Ambulatory Visit | Attending: Family Medicine | Admitting: Family Medicine

## 2015-02-07 ENCOUNTER — Other Ambulatory Visit: Payer: Self-pay | Admitting: Family Medicine

## 2015-02-07 DIAGNOSIS — Z1231 Encounter for screening mammogram for malignant neoplasm of breast: Secondary | ICD-10-CM | POA: Insufficient documentation

## 2015-02-13 ENCOUNTER — Other Ambulatory Visit: Payer: Self-pay | Admitting: Family Medicine

## 2015-02-13 DIAGNOSIS — R928 Other abnormal and inconclusive findings on diagnostic imaging of breast: Secondary | ICD-10-CM

## 2015-02-23 ENCOUNTER — Ambulatory Visit
Admission: RE | Admit: 2015-02-23 | Discharge: 2015-02-23 | Disposition: A | Discharge status: Home / Self Care | Payer: Medicare Other | Source: Ambulatory Visit | Attending: Family Medicine | Admitting: Family Medicine

## 2015-02-23 DIAGNOSIS — R928 Other abnormal and inconclusive findings on diagnostic imaging of breast: Secondary | ICD-10-CM

## 2015-02-23 DIAGNOSIS — N63 Unspecified lump in breast: Secondary | ICD-10-CM | POA: Insufficient documentation

## 2015-02-27 ENCOUNTER — Other Ambulatory Visit: Payer: Self-pay | Admitting: Family Medicine

## 2015-02-27 DIAGNOSIS — N6320 Unspecified lump in the left breast, unspecified quadrant: Secondary | ICD-10-CM

## 2015-03-07 ENCOUNTER — Ambulatory Visit
Admission: RE | Admit: 2015-03-07 | Discharge: 2015-03-07 | Disposition: A | Discharge status: Home / Self Care | Payer: Medicare Other | Source: Ambulatory Visit | Attending: Family Medicine | Admitting: Family Medicine

## 2015-03-07 ENCOUNTER — Other Ambulatory Visit: Payer: Self-pay | Admitting: Family Medicine

## 2015-03-07 ENCOUNTER — Other Ambulatory Visit: Payer: Self-pay | Admitting: Diagnostic Radiology

## 2015-03-07 DIAGNOSIS — N6310 Unspecified lump in the right breast, unspecified quadrant: Secondary | ICD-10-CM

## 2015-03-07 DIAGNOSIS — N6320 Unspecified lump in the left breast, unspecified quadrant: Secondary | ICD-10-CM

## 2015-03-07 DIAGNOSIS — N63 Unspecified lump in breast: Secondary | ICD-10-CM | POA: Diagnosis not present

## 2015-07-14 LAB — SURGICAL PATHOLOGY

## 2015-12-01 ENCOUNTER — Other Ambulatory Visit: Payer: Self-pay | Admitting: Family Medicine

## 2015-12-01 DIAGNOSIS — N63 Unspecified lump in unspecified breast: Secondary | ICD-10-CM

## 2015-12-04 ENCOUNTER — Other Ambulatory Visit: Payer: Self-pay | Admitting: Family Medicine

## 2015-12-04 DIAGNOSIS — N63 Unspecified lump in unspecified breast: Secondary | ICD-10-CM

## 2015-12-15 ENCOUNTER — Ambulatory Visit
Admission: RE | Admit: 2015-12-15 | Discharge: 2015-12-15 | Disposition: A | Discharge status: Home / Self Care | Payer: Medicare Other | Source: Ambulatory Visit | Attending: Family Medicine | Admitting: Family Medicine

## 2015-12-15 DIAGNOSIS — N63 Unspecified lump in unspecified breast: Secondary | ICD-10-CM

## 2016-11-25 ENCOUNTER — Other Ambulatory Visit: Payer: Self-pay | Admitting: Family Medicine

## 2016-11-25 DIAGNOSIS — Z1231 Encounter for screening mammogram for malignant neoplasm of breast: Secondary | ICD-10-CM

## 2016-12-17 ENCOUNTER — Ambulatory Visit
Admission: RE | Admit: 2016-12-17 | Discharge: 2016-12-17 | Disposition: A | Discharge status: Home / Self Care | Payer: Medicare Other | Source: Ambulatory Visit | Attending: Family Medicine | Admitting: Family Medicine

## 2016-12-17 DIAGNOSIS — Z1231 Encounter for screening mammogram for malignant neoplasm of breast: Secondary | ICD-10-CM | POA: Insufficient documentation

## 2018-01-08 ENCOUNTER — Other Ambulatory Visit: Payer: Self-pay | Admitting: Family Medicine

## 2018-01-08 DIAGNOSIS — Z1231 Encounter for screening mammogram for malignant neoplasm of breast: Secondary | ICD-10-CM

## 2018-01-23 ENCOUNTER — Ambulatory Visit
Admission: RE | Admit: 2018-01-23 | Discharge: 2018-01-23 | Disposition: A | Discharge status: Home / Self Care | Payer: Medicare Other | Source: Ambulatory Visit | Attending: Family Medicine | Admitting: Family Medicine

## 2018-01-23 DIAGNOSIS — Z1231 Encounter for screening mammogram for malignant neoplasm of breast: Secondary | ICD-10-CM | POA: Insufficient documentation

## 2019-04-26 ENCOUNTER — Other Ambulatory Visit: Payer: Self-pay | Admitting: Family Medicine

## 2019-04-26 DIAGNOSIS — Z1231 Encounter for screening mammogram for malignant neoplasm of breast: Secondary | ICD-10-CM

## 2019-04-27 ENCOUNTER — Other Ambulatory Visit: Payer: Self-pay

## 2019-04-27 ENCOUNTER — Telehealth: Payer: Self-pay

## 2019-04-27 DIAGNOSIS — Z1211 Encounter for screening for malignant neoplasm of colon: Secondary | ICD-10-CM

## 2019-04-27 MED ORDER — PEG 3350-KCL-NA BICARB-NACL 420 G PO SOLR: 4000.0000 mL | Freq: Once | ORAL | 0 refills | Status: DC

## 2019-04-27 MED ORDER — PEG 3350-KCL-NA BICARB-NACL 420 G PO SOLR: 4000.0000 mL | Freq: Once | ORAL | 0 refills | Status: AC

## 2019-04-27 NOTE — Telephone Encounter (Signed)
Gastroenterology Pre-Procedure Review    PATIENT REVIEW QUESTIONS: The patient responded to the following health history questions as indicated:    1. Are you having any GI issues? no 2. Do you have a personal history of Polyps? no 3. Do you have a family history of Colon Cancer or Polyps? no 4. Diabetes Mellitus? no 5. Joint replacements in the past 12 months?no 6. Major health problems in the past 3 months?no 7. Any artificial heart valves, MVP, or defibrillator?no    MEDICATIONS & ALLERGIES:    Patient reports the following regarding taking any anticoagulation/antiplatelet therapy:   Plavix, Coumadin, Eliquis, Xarelto, Lovenox, Pradaxa, Brilinta, or Effient? no Aspirin? no  Patient confirms/reports the following medications:  Current Outpatient Medications  Medication Sig Dispense Refill  . docusate sodium (COLACE) 100 MG capsule Take 1 capsule (100 mg total) by mouth 2 (two) times daily. 60 capsule 0  . medroxyPROGESTERone (DEPO-PROVERA) 150 MG/ML injection Inject 150 mg into the muscle every 3 (three) months.     No current facility-administered medications for this visit.    Patient confirms/reports the following allergies:  Allergies  Allergen Reactions  . Sulfa Antibiotics Hives    No orders of the defined types were placed in this encounter.   AUTHORIZATION INFORMATION Primary Insurance: 1D#: Group #:  Secondary Insurance: 1D#: Group #:  SCHEDULE INFORMATION: Date: 05/25/2019 Time: Location:[facility]

## 2019-05-20 ENCOUNTER — Telehealth: Payer: Self-pay | Admitting: Gastroenterology

## 2019-05-20 NOTE — Telephone Encounter (Signed)
Patients call has been returned.  Her colonoscopy has been rescheduled to Thursday 06/10/19 with Dr. Vicente Males still at [facility].  Pt has been advised of her COVID test date Tuesday 06/08/19.  New instructions will be sent to her in the mail.  Magda Paganini in Endo has been made aware of new date. Referral updated.  Thanks [employer]

## 2019-05-20 NOTE — Telephone Encounter (Signed)
Pt left vm she needs to cancel her covi19 test and procedure due to her ride please call pt

## 2019-05-21 ENCOUNTER — Other Ambulatory Visit: Admission: RE | Admit: 2019-05-21 | Payer: Medicare Other | Source: Ambulatory Visit

## 2019-05-24 ENCOUNTER — Telehealth: Payer: Self-pay

## 2019-05-24 NOTE — Telephone Encounter (Signed)
Called and left a message for call back to rescheduled the procedure scheduled on 06/10/2019

## 2019-05-24 NOTE — Telephone Encounter (Signed)
Patient states she wants to cancel her procedure and would like to be called when she can rescheduled this

## 2019-05-27 ENCOUNTER — Telehealth: Payer: Self-pay | Admitting: Gastroenterology

## 2019-05-27 NOTE — Telephone Encounter (Signed)
Returned patients call to schedule her colonoscopy.  LVM for her to call me back to schedule.  Thanks [employer]

## 2019-05-27 NOTE — Telephone Encounter (Signed)
Pt left vm to r/s her colonoscopy

## 2019-06-01 ENCOUNTER — Other Ambulatory Visit: Payer: Self-pay

## 2019-06-01 ENCOUNTER — Telehealth: Payer: Self-pay

## 2019-06-01 DIAGNOSIS — Z1211 Encounter for screening for malignant neoplasm of colon: Secondary | ICD-10-CM

## 2019-06-01 NOTE — Telephone Encounter (Signed)
Scheduled patient for colonoscopy on 07/09/2019 with Dr. Vicente Males. Mailed patient new instructions and patient states she has the prep at home.

## 2019-06-08 ENCOUNTER — Other Ambulatory Visit: Admission: RE | Admit: 2019-06-08 | Payer: Medicare Other | Source: Ambulatory Visit

## 2019-06-08 ENCOUNTER — Other Ambulatory Visit: Payer: Self-pay

## 2019-06-08 DIAGNOSIS — Z1211 Encounter for screening for malignant neoplasm of colon: Secondary | ICD-10-CM

## 2019-06-10 ENCOUNTER — Encounter: Admission: RE | Payer: Self-pay | Source: Home / Self Care

## 2019-06-10 ENCOUNTER — Ambulatory Visit: Admission: RE | Admit: 2019-06-10 | Payer: Medicare Other | Source: Home / Self Care | Admitting: Gastroenterology

## 2019-06-10 SURGERY — COLONOSCOPY WITH PROPOFOL
Anesthesia: General

## 2019-06-29 ENCOUNTER — Ambulatory Visit
Admission: RE | Admit: 2019-06-29 | Discharge: 2019-06-29 | Disposition: A | Discharge status: Home / Self Care | Payer: Medicare Other | Source: Ambulatory Visit | Attending: Family Medicine | Admitting: Family Medicine

## 2019-06-29 DIAGNOSIS — Z1231 Encounter for screening mammogram for malignant neoplasm of breast: Secondary | ICD-10-CM | POA: Diagnosis present

## 2019-07-09 ENCOUNTER — Ambulatory Visit: Admit: 2019-07-09 | Payer: Medicare Other | Admitting: Gastroenterology

## 2019-07-09 SURGERY — COLONOSCOPY WITH PROPOFOL
Anesthesia: Choice

## 2019-07-21 ENCOUNTER — Other Ambulatory Visit
Admission: RE | Admit: 2019-07-21 | Discharge: 2019-07-21 | Disposition: A | Discharge status: Home / Self Care | Payer: Medicare Other | Source: Ambulatory Visit | Attending: Gastroenterology | Admitting: Gastroenterology

## 2019-07-21 DIAGNOSIS — Z20822 Contact with and (suspected) exposure to covid-19: Secondary | ICD-10-CM | POA: Insufficient documentation

## 2019-07-21 DIAGNOSIS — Z01812 Encounter for preprocedural laboratory examination: Secondary | ICD-10-CM | POA: Insufficient documentation

## 2019-07-21 LAB — SARS CORONAVIRUS 2 (TAT 6-24 HRS): SARS Coronavirus 2: NEGATIVE

## 2019-07-22 ENCOUNTER — Encounter: Payer: Self-pay | Admitting: Gastroenterology

## 2019-07-23 ENCOUNTER — Ambulatory Visit
Admission: RE | Admit: 2019-07-23 | Discharge: 2019-07-23 | Disposition: A | Discharge status: Home / Self Care | Payer: Medicare Other | Attending: Gastroenterology | Admitting: Gastroenterology

## 2019-07-23 ENCOUNTER — Encounter
Admission: RE | Disposition: A | Discharge status: Home / Self Care | Payer: Self-pay | Source: Home / Self Care | Attending: Gastroenterology

## 2019-07-23 ENCOUNTER — Ambulatory Visit: Payer: Medicare Other | Admitting: Certified Registered Nurse Anesthetist

## 2019-07-23 ENCOUNTER — Encounter: Payer: Self-pay | Admitting: Gastroenterology

## 2019-07-23 DIAGNOSIS — K635 Polyp of colon: Secondary | ICD-10-CM | POA: Diagnosis not present

## 2019-07-23 DIAGNOSIS — D124 Benign neoplasm of descending colon: Secondary | ICD-10-CM | POA: Insufficient documentation

## 2019-07-23 DIAGNOSIS — E669 Obesity, unspecified: Secondary | ICD-10-CM | POA: Insufficient documentation

## 2019-07-23 DIAGNOSIS — Z1211 Encounter for screening for malignant neoplasm of colon: Secondary | ICD-10-CM | POA: Diagnosis present

## 2019-07-23 DIAGNOSIS — Z6837 Body mass index (BMI) 37.0-37.9, adult: Secondary | ICD-10-CM | POA: Insufficient documentation

## 2019-07-23 DIAGNOSIS — Z87891 Personal history of nicotine dependence: Secondary | ICD-10-CM | POA: Diagnosis not present

## 2019-07-23 DIAGNOSIS — Z793 Long term (current) use of hormonal contraceptives: Secondary | ICD-10-CM | POA: Diagnosis not present

## 2019-07-23 DIAGNOSIS — Z79899 Other long term (current) drug therapy: Secondary | ICD-10-CM | POA: Insufficient documentation

## 2019-07-23 HISTORY — PX: COLONOSCOPY WITH PROPOFOL: SHX5780

## 2019-07-23 LAB — POCT PREGNANCY, URINE: Preg Test, Ur: NEGATIVE

## 2019-07-23 SURGERY — COLONOSCOPY WITH PROPOFOL
Anesthesia: General

## 2019-07-23 MED ORDER — PROPOFOL 500 MG/50ML IV EMUL
INTRAVENOUS | Status: AC
  Filled 2019-07-23: qty 50

## 2019-07-23 MED ORDER — PROPOFOL 500 MG/50ML IV EMUL
INTRAVENOUS | Status: DC | PRN
  Administered 2019-07-23: 60 mg via INTRAVENOUS
  Administered 2019-07-23: 40 mg via INTRAVENOUS
  Administered 2019-07-23: 30 mg via INTRAVENOUS

## 2019-07-23 MED ORDER — PROPOFOL 500 MG/50ML IV EMUL
INTRAVENOUS | Status: DC | PRN
  Administered 2019-07-23: 150 ug/kg/min via INTRAVENOUS

## 2019-07-23 MED ORDER — LIDOCAINE HCL (PF) 2 % IJ SOLN
INTRAMUSCULAR | Status: AC
  Filled 2019-07-23: qty 10

## 2019-07-23 MED ORDER — LIDOCAINE HCL (CARDIAC) PF 100 MG/5ML IV SOSY
PREFILLED_SYRINGE | INTRAVENOUS | Status: DC | PRN
  Administered 2019-07-23: 100 mg via INTRAVENOUS

## 2019-07-23 MED ORDER — GLYCOPYRROLATE 0.2 MG/ML IJ SOLN
INTRAMUSCULAR | Status: DC | PRN
  Administered 2019-07-23: .2 mg via INTRAVENOUS

## 2019-07-23 MED ORDER — SODIUM CHLORIDE 0.9 % IV SOLN
INTRAVENOUS | Status: DC
  Administered 2019-07-23: 1000 mL via INTRAVENOUS

## 2019-07-23 NOTE — Transfer of Care (Signed)
Immediate Anesthesia Transfer of Care Note  Patient: Leslie Chavez  Procedure(s) Performed: COLONOSCOPY WITH PROPOFOL (N/A )  Patient Location: PACU  Anesthesia Type:General  Level of Consciousness: drowsy  Airway & Oxygen Therapy: Patient Spontanous Breathing  Post-op Assessment: Report given to RN and Post -op Vital signs reviewed and stable  Post vital signs: Reviewed and stable  Last Vitals:  Vitals Value Taken Time  BP    Temp    Pulse 108 07/23/19 1016  Resp 36 07/23/19 1016  SpO2 100 % 07/23/19 1016  Vitals shown include unvalidated device data.  Last Pain:  Vitals:   07/23/19 0849  TempSrc: Temporal  PainSc: 0-No pain         Complications: No apparent anesthesia complications

## 2019-07-23 NOTE — Anesthesia Postprocedure Evaluation (Signed)
Anesthesia Post Note  Patient: Leslie Chavez  Procedure(s) Performed: COLONOSCOPY WITH PROPOFOL (N/A )  Patient location during evaluation: Endoscopy Anesthesia Type: General Level of consciousness: awake and alert Pain management: pain level controlled Vital Signs Assessment: post-procedure vital signs reviewed and stable Respiratory status: spontaneous breathing, nonlabored ventilation, respiratory function stable and patient connected to nasal cannula oxygen Cardiovascular status: blood pressure returned to baseline and stable Postop Assessment: no apparent nausea or vomiting Anesthetic complications: no     Last Vitals:  Vitals:   07/23/19 0849 07/23/19 1015  BP: 134/85 (!) 106/57  Pulse: (!) 119 (!) 108  Resp: 16   Temp: (!) 36.3 C (!) 36.1 C  SpO2: 98% 100%    Last Pain:  Vitals:   07/23/19 0849  TempSrc: Temporal  PainSc: 0-No pain                 Arita Miss

## 2019-07-23 NOTE — Op Note (Signed)
[facility] Gastroenterology Patient Name: Leslie Chavez Procedure Date: 07/23/2019 9:46 AM MRN: [id number] Account #: [id number] Date of Birth: [birth year] Admit Type: Outpatient Age: 54 Room: [facility] ENDO ROOM 4 Gender: Female Note Status: Finalized Procedure:             Colonoscopy Indications:           Screening for colorectal malignant neoplasm, This is                         the patient's first colonoscopy Providers:             Lin Landsman MD, MD Referring MD:          Edmonia Lynch. Aycock MD (Referring MD) Medicines:             Monitored Anesthesia Care Complications:         No immediate complications. Estimated blood loss: None. Procedure:             Pre-Anesthesia Assessment:                        - Prior to the procedure, a History and Physical was                         performed, and patient medications and allergies were                         reviewed. The patient is competent. The risks and                         benefits of the procedure and the sedation options and                         risks were discussed with the patient. All questions                         were answered and informed consent was obtained.                         Patient identification and proposed procedure were                         verified by the physician, the nurse, the                         anesthesiologist, the anesthetist and the technician                         in the pre-procedure area in the procedure room in the                         endoscopy suite. Mental Status Examination: alert and                         oriented. Airway Examination: normal oropharyngeal                         airway and neck mobility. Respiratory Examination:  clear to auscultation. CV Examination: normal.                         Prophylactic Antibiotics: The patient does not require                         prophylactic antibiotics. Prior  Anticoagulants: The                         patient has taken no previous anticoagulant or                         antiplatelet agents. ASA Grade Assessment: II - A                         patient with mild systemic disease. After reviewing                         the risks and benefits, the patient was deemed in                         satisfactory condition to undergo the procedure. The                         anesthesia plan was to use monitored anesthesia care                         (MAC). Immediately prior to administration of                         medications, the patient was re-assessed for adequacy                         to receive sedatives. The heart rate, respiratory                         rate, oxygen saturations, blood pressure, adequacy of                         pulmonary ventilation, and response to care were                         monitored throughout the procedure. The physical                         status of the patient was re-assessed after the                         procedure.                        After obtaining informed consent, the colonoscope was                         passed under direct vision. Throughout the procedure,                         the patient's blood pressure, pulse, and oxygen  saturations were monitored continuously. The                         Colonoscope was introduced through the anus and                         advanced to the the cecum, identified by appendiceal                         orifice and ileocecal valve. The colonoscopy was                         performed without difficulty. The patient tolerated                         the procedure well. The quality of the bowel                         preparation was evaluated using the BBPS [facility] Bowel                         Preparation Scale) with scores of: Right Colon = 3,                         Transverse Colon = 3 and Left Colon = 3 (entire mucosa                          seen well with no residual staining, small fragments                         of stool or opaque liquid). The total BBPS score                         equals 9. Findings:      The perianal and digital rectal examinations were normal. Pertinent       negatives include normal sphincter tone and no palpable rectal lesions.      A 8 mm polyp was found in the descending colon. The polyp was       semi-pedunculated. The polyp was removed with a hot snare. Resection and       retrieval were complete.      The retroflexed view of the distal rectum and anal verge was normal and       showed no anal or rectal abnormalities.      The exam was otherwise without abnormality.      The terminal ileum appeared normal. Impression:            - One 8 mm polyp in the descending colon, removed with                         a hot snare. Resected and retrieved.                        - The distal rectum and anal verge are normal on                         retroflexion view.                        -  The examination was otherwise normal. Recommendation:        - Discharge patient to home (with escort).                        - Resume previous diet today.                        - Continue present medications.                        - Await pathology results.                        - Repeat colonoscopy in 5 years for surveillance. Procedure Code(s):     --- Professional ---                        [phone], Colonoscopy, flexible; with removal of                         tumor(s), polyp(s), or other lesion(s) by snare                         technique Diagnosis Code(s):     --- Professional ---                        Z12.11, Encounter for screening for malignant neoplasm                         of colon                        K63.5, Polyp of colon CPT copyright 2019 American Medical Association. All rights reserved. The codes documented in this report are preliminary and upon coder review may  be  revised to meet current compliance requirements. Dr. Ulyess Mort Lin Landsman MD, MD 07/23/2019 10:14:59 AM This report has been signed electronically. Number of Addenda: 0 Note Initiated On: 07/23/2019 9:46 AM Scope Withdrawal Time: 0 hours 12 minutes 40 seconds  Total Procedure Duration: 0 hours 15 minutes 45 seconds  Estimated Blood Loss:  Estimated blood loss: none.      [facility]

## 2019-07-23 NOTE — H&P (Signed)
Leslie Darby, MD [address]  [place]  [place], [place] [address]  Main: [phone]  Fax: [phone] Pager: [phone]  Primary Care Physician:  Leslie Coffin, MD Primary Gastroenterologist:  Dr. Cephas Chavez  Pre-Procedure History & Physical: HPI:  Leslie Chavez is a 54 y.o. female is here for an colonoscopy.   History reviewed. No pertinent past medical history.  Past Surgical History:  Procedure Laterality Date  . BREAST BIOPSY Right    03/07/2015, neg    Prior to Admission medications   Medication Sig Start Date End Date Taking? Authorizing Provider  docusate sodium (COLACE) 100 MG capsule Take 1 capsule (100 mg total) by mouth 2 (two) times daily. 11/17/14  Yes [name], MD  medroxyPROGESTERone (DEPO-PROVERA) 150 MG/ML injection Inject 150 mg into the muscle every 3 (three) months. 08/31/14  Yes [provider]    Allergies as of 06/08/2019 - Review Complete 12/06/2014  Allergen Reaction Noted  . Sulfa antibiotics Hives 11/13/2014    Family History  Problem Relation Age of Onset  . Nephrolithiasis Sister   . Breast cancer Mother 18    Social History   Socioeconomic History  . Marital status: Married    Spouse name: Not on file  . Number of children: Not on file  . Years of education: Not on file  . Highest education level: Not on file  Occupational History  . Not on file  Tobacco Use  . Smoking status: Former Smoker  Substance and Sexual Activity  . Alcohol use: Yes    Alcohol/week: 1.0 standard drinks    Types: 1 Standard drinks or equivalent per week  . Drug use: No  . Sexual activity: Not on file  Other Topics Concern  . Not on file  Social History Narrative  . Not on file   Social Determinants of Health   Financial Resource Strain:   . Difficulty of Paying Living Expenses:   Food Insecurity:   . Worried About Charity fundraiser in the Last Year:   . Arboriculturist in the Last Year:   Transportation  Needs:   . Film/video editor (Medical):   Marland Kitchen Lack of Transportation (Non-Medical):   Physical Activity:   . Days of Exercise per Week:   . Minutes of Exercise per Session:   Stress:   . Feeling of Stress :   Social Connections:   . Frequency of Communication with Friends and Family:   . Frequency of Social Gatherings with Friends and Family:   . Attends Religious Services:   . Active Member of Clubs or Organizations:   . Attends Archivist Meetings:   Marland Kitchen Marital Status:   Intimate Partner Violence:   . Fear of Current or Ex-Partner:   . Emotionally Abused:   Marland Kitchen Physically Abused:   . Sexually Abused:     Review of Systems: See HPI, otherwise negative ROS  Physical Exam: BP 134/85   Pulse (!) 119   Temp (!) 97.4 F (36.3 C) (Temporal)   Resp 16   Ht 5\' 1"  (1.549 m)   Wt 89.4 kg   SpO2 98%   BMI 37.22 kg/m  General:   Alert,  pleasant and cooperative in NAD Head:  Normocephalic and atraumatic. Neck:  Supple; no masses or thyromegaly. Lungs:  Clear throughout to auscultation.    Heart:  Regular rate and rhythm. Abdomen:  Soft, nontender and nondistended. Normal bowel sounds, without guarding, and without rebound.  Neurologic:  Alert and  oriented x4;  grossly normal neurologically.  Impression/Plan: Leslie Chavez is here for an colonoscopy to be performed for colon cancer screening  Risks, benefits, limitations, and alternatives regarding  colonoscopy have been reviewed with the patient.  Questions have been answered.  All parties agreeable.   Sherri Sear, MD  07/23/2019, 9:12 AM

## 2019-07-23 NOTE — Anesthesia Preprocedure Evaluation (Signed)
Anesthesia Evaluation  Patient identified by MRN, date of birth, ID band Patient awake    Reviewed: Allergy & Precautions, H&P , NPO status , Patient's Chart, lab work & pertinent test results, reviewed documented beta blocker date and time   History of Anesthesia Complications Negative for: history of anesthetic complications  Airway Mallampati: III  TM Distance: >3 FB Neck ROM: full    Dental  (+) Partial Upper, Dental Advidsory Given, Missing, Poor Dentition   Pulmonary neg pulmonary ROS, former smoker,    Pulmonary exam normal        Cardiovascular Exercise Tolerance: Good negative cardio ROS Normal cardiovascular exam     Neuro/Psych negative neurological ROS  negative psych ROS   GI/Hepatic negative GI ROS, Neg liver ROS,   Endo/Other  negative endocrine ROS  Renal/GU Renal disease (kidney stones)  negative genitourinary   Musculoskeletal   Abdominal   Peds  Hematology negative hematology ROS (+)   Anesthesia Other Findings History reviewed. No pertinent past medical history.  Obesity  Reproductive/Obstetrics negative OB ROS                             Anesthesia Physical Anesthesia Plan  ASA: II  Anesthesia Plan: General   Post-op Pain Management:    Induction: Intravenous  PONV Risk Score and Plan: 3 and Propofol infusion and TIVA  Airway Management Planned: Natural Airway and Nasal Cannula  Additional Equipment:   Intra-op Plan:   Post-operative Plan:   Informed Consent: I have reviewed the patients History and Physical, chart, labs and discussed the procedure including the risks, benefits and alternatives for the proposed anesthesia with the patient or authorized representative who has indicated his/her understanding and acceptance.     Dental Advisory Given  Plan Discussed with: Anesthesiologist, CRNA and Surgeon  Anesthesia Plan Comments:          Anesthesia Quick Evaluation

## 2019-07-26 ENCOUNTER — Encounter: Payer: Self-pay | Admitting: Gastroenterology

## 2019-07-26 LAB — SURGICAL PATHOLOGY

## 2020-05-24 ENCOUNTER — Other Ambulatory Visit: Payer: Self-pay | Admitting: Family Medicine

## 2020-05-24 DIAGNOSIS — Z1231 Encounter for screening mammogram for malignant neoplasm of breast: Secondary | ICD-10-CM

## 2020-06-17 ENCOUNTER — Encounter: Payer: Self-pay | Admitting: Emergency Medicine

## 2020-06-17 ENCOUNTER — Emergency Department: Payer: Medicare Other

## 2020-06-17 ENCOUNTER — Emergency Department
Admission: EM | Admit: 2020-06-17 | Discharge: 2020-06-17 | Disposition: A | Discharge status: Home / Self Care | Payer: Medicare Other | Attending: Emergency Medicine | Admitting: Emergency Medicine

## 2020-06-17 ENCOUNTER — Other Ambulatory Visit: Payer: Self-pay

## 2020-06-17 DIAGNOSIS — S6991XA Unspecified injury of right wrist, hand and finger(s), initial encounter: Secondary | ICD-10-CM | POA: Diagnosis present

## 2020-06-17 DIAGNOSIS — Z87891 Personal history of nicotine dependence: Secondary | ICD-10-CM | POA: Insufficient documentation

## 2020-06-17 DIAGNOSIS — S52501A Unspecified fracture of the lower end of right radius, initial encounter for closed fracture: Secondary | ICD-10-CM

## 2020-06-17 DIAGNOSIS — S52351A Displaced comminuted fracture of shaft of radius, right arm, initial encounter for closed fracture: Secondary | ICD-10-CM | POA: Diagnosis not present

## 2020-06-17 DIAGNOSIS — W19XXXA Unspecified fall, initial encounter: Secondary | ICD-10-CM | POA: Insufficient documentation

## 2020-06-17 DIAGNOSIS — T148XXA Other injury of unspecified body region, initial encounter: Secondary | ICD-10-CM

## 2020-06-17 MED ORDER — BUPIVACAINE HCL 0.5 % IJ SOLN
50.0000 mL | Freq: Once | INTRAMUSCULAR | Status: AC
  Administered 2020-06-17: 50 mL
  Filled 2020-06-17: qty 50

## 2020-06-17 MED ORDER — ORPHENADRINE CITRATE 30 MG/ML IJ SOLN
60.0000 mg | Freq: Two times a day (BID) | INTRAMUSCULAR | Status: DC
  Administered 2020-06-17: 60 mg via INTRAMUSCULAR
  Filled 2020-06-17: qty 2

## 2020-06-17 MED ORDER — ACETAMINOPHEN 500 MG PO TABS
1000.0000 mg | ORAL_TABLET | Freq: Once | ORAL | Status: AC
  Administered 2020-06-17: 1000 mg via ORAL
  Filled 2020-06-17: qty 2

## 2020-06-17 MED ORDER — OXYCODONE-ACETAMINOPHEN 7.5-325 MG PO TABS: 1.0000 | ORAL_TABLET | ORAL | 0 refills | Status: DC | PRN

## 2020-06-17 MED ORDER — IBUPROFEN 600 MG PO TABS
600.0000 mg | ORAL_TABLET | Freq: Three times a day (TID) | ORAL | 0 refills | Status: DC | PRN
Start: 2020-06-17 — End: 2021-07-16

## 2020-06-17 MED ORDER — SODIUM BICARBONATE 8.4 % IV SOLN
50.0000 meq | Freq: Once | INTRAVENOUS | Status: AC
  Administered 2020-06-17: 50 meq via INTRAVENOUS
  Filled 2020-06-17: qty 50

## 2020-06-17 MED ORDER — HYDROMORPHONE HCL 1 MG/ML IJ SOLN
1.0000 mg | Freq: Once | INTRAMUSCULAR | Status: AC
  Administered 2020-06-17: 1 mg via INTRAMUSCULAR
  Filled 2020-06-17: qty 1

## 2020-06-17 NOTE — Discharge Instructions (Addendum)
Follow discharge care instructions to include signs and symptoms of compartment syndrome.  Return to ED if increased swelling or numbness.  Call the orthopedic doctor listed in your discharge care instructions on Monday morning at 830.  Tell the clinic that you're a follow-up from the emergency room.  They will tell you what time to come in.  Be advised pain medication may cause drowsiness.  Do not drive operate vehicles while taking this medication.

## 2020-06-17 NOTE — ED Triage Notes (Signed)
Pt reports fell, hurt her wrist, went to [facility] and was told it was broke and to come to the ED

## 2020-06-17 NOTE — ED Provider Notes (Signed)
[facility] Emergency Department Provider Note   ____________________________________________   Event Date/Time   First MD Initiated Contact with Patient 06/17/20 1355     (approximate)  I have reviewed the triage vital signs and the nursing notes.   HISTORY  Chief Complaint Wrist Pain    HPI Collyn Selk is a 55 y.o. female patient presents with right wrist pain second to fall which occurred yesterday.  Patient denies LOC or head injury.  Patient that she was seen in urgent care clinic today and was told that the wrist was broken and follow-up with the emergency room since they cannot splint her.  Patient presents with arm in a sling.  Patient rates pain as a 10/10.  Patient described pain as "achy".  Patient pain increased with motion of the wrist.  Patient has loss of sensation.  Patient is right-hand dominant.         History reviewed. No pertinent past medical history.  Patient Active Problem List   Diagnosis Date Noted  . Screening for colon cancer     Past Surgical History:  Procedure Laterality Date  . BREAST BIOPSY Right    03/07/2015, neg  . COLONOSCOPY WITH PROPOFOL N/A 07/23/2019   Procedure: COLONOSCOPY WITH PROPOFOL;  Surgeon: Lin Landsman, MD;  Location: [facility] ENDOSCOPY;  Service: Gastroenterology;  Laterality: N/A;    Prior to Admission medications   Medication Sig Start Date End Date Taking? Authorizing Provider  ibuprofen (ADVIL) 600 MG tablet Take 1 tablet (600 mg total) by mouth every 8 (eight) hours as needed. 06/17/20  Yes Sable Feil, PA-C  oxyCODONE-acetaminophen (PERCOCET) 7.5-325 MG tablet Take 1 tablet by mouth every 4 (four) hours as needed for severe pain. 06/17/20  Yes Sable Feil, PA-C  docusate sodium (COLACE) 100 MG capsule Take 1 capsule (100 mg total) by mouth 2 (two) times daily. 11/17/14   [name], MD  medroxyPROGESTERone (DEPO-PROVERA) 150 MG/ML injection Inject 150 mg into the muscle every  3 (three) months. 08/31/14   [provider]    Allergies Sulfa antibiotics  Family History  Problem Relation Age of Onset  . Nephrolithiasis Sister   . Breast cancer Mother 3    Social History Social History   Tobacco Use  . Smoking status: Former Tour manager  . Vaping Use: Never used  Substance Use Topics  . Alcohol use: Yes    Alcohol/week: 1.0 standard drink    Types: 1 Standard drinks or equivalent per week  . Drug use: No    Review of Systems Constitutional: No fever/chills Eyes: No visual changes. ENT: No sore throat. Cardiovascular: Denies chest pain. Respiratory: Denies shortness of breath. Gastrointestinal: No abdominal pain.  No nausea, no vomiting.  No diarrhea.  No constipation. Genitourinary: Negative for dysuria. Musculoskeletal: Right hand pain. Skin: Negative for rash. Neurological: Negative for headaches, focal weakness or numbness. Allergic/Immunilogical: Sulfur antibiotics  ____________________________________________   PHYSICAL EXAM:  VITAL SIGNS: ED Triage Vitals  Enc Vitals Group     BP 06/17/20 1338 (!) 142/77     Pulse Rate 06/17/20 1338 96     Resp 06/17/20 1338 18     Temp 06/17/20 1338 97.7 F (36.5 C)     Temp Source 06/17/20 1338 Oral     SpO2 06/17/20 1338 96 %     Weight 06/17/20 1335 197 lb 1.5 oz (89.4 kg)     Height 06/17/20 1335 5\' 1"  (1.549 m)  Head Circumference --      Peak Flow --      Pain Score 06/17/20 1335 10     Pain Loc --      Pain Edu? --      Excl. in [place]? --     Constitutional: Alert and oriented. Well appearing and in no acute distress. Hematological/Lymphatic/Immunilogical: No cervical lymphadenopathy. Cardiovascular: Normal rate, regular rhythm. Grossly normal heart sounds.  Good peripheral circulation. Respiratory: Normal respiratory effort.  No retractions. Lungs CTAB. Gastrointestinal: Soft and nontender. No distention. No abdominal bruits. No CVA tenderness. Musculoskeletal:   Neurologic:  Normal speech and language. No gross focal neurologic deficits are appreciated. No gait instability. Skin:  Skin is warm, dry and intact. No rash noted.  No abrasion or ecchymosis. Psychiatric: Mood and affect are normal. Speech and behavior are normal.  ____________________________________________   LABS (all labs ordered are listed, but only abnormal results are displayed)  Labs Reviewed - No data to display ____________________________________________  EKG   ____________________________________________  RADIOLOGY I, Sable Feil, personally viewed and evaluated these images (plain radiographs) as part of my medical decision making, as well as reviewing the written report by the radiologist.  ED MD interpretation: comminuted and displaced fracture of the distal radius.  Official radiology report(s): DG Wrist 2 Views Right  Result Date: 06/17/2020 CLINICAL DATA:  Reduction film EXAM: RIGHT WRIST - 2 VIEW COMPARISON:  Same day radiograph FINDINGS: Revisualization of a comminuted fracture of the distal radius with intra-articular extension. There is dorsal displacement of the distal fragment. There is mildly decreased foreshortening. This is similar in comparison to prior when accounting for differences in rotation. Soft tissue edema. IMPRESSION: Similar dorsal displacement of the distal comminuted fragment of the distal radius when accounting for differences in rotation. There is mildly decreased foreshortening. Electronically Signed   By: Valentino Saxon MD   On: 06/17/2020 18:03   DG Wrist 2 Views Right  Result Date: 06/17/2020 CLINICAL DATA:  Status post reduction EXAM: RIGHT WRIST - 1 VIEW COMPARISON:  Same day radiograph FINDINGS: Single-view radiograph demonstrates persistent dorsal displacement of the distal comminuted fragment of the distal radial fracture. There is unchanged alignment comparison to prior. Soft tissue edema. IMPRESSION: Unchanged alignment with  dorsal displacement of the comminuted distal radial fracture. Electronically Signed   By: Valentino Saxon MD   On: 06/17/2020 17:10   DG Wrist Complete Right  Result Date: 06/17/2020 CLINICAL DATA:  Post reduction. EXAM: RIGHT WRIST - COMPLETE 3+ VIEW COMPARISON:  June 17, 2020 at 2:19 p.m. FINDINGS: There has been interval reduction of the transverse displaced and comminuted fracture of the right distal radius. No significant change in alignment is seen. There is significant soft tissue swelling about the wrist. IMPRESSION: No significant change in alignment post reduction of the posteriorly displaced and angulated distal right radial fracture. Significant surrounding soft tissue swelling. Electronically Signed   By: Fidela Salisbury M.D.   On: 06/17/2020 16:09   DG Wrist Complete Right  Result Date: 06/17/2020 CLINICAL DATA:  Right wrist pain after fall. EXAM: RIGHT WRIST - COMPLETE 3+ VIEW COMPARISON:  None. FINDINGS: Severely displaced and comminuted fracture is seen involving the distal right radius. Posterior dislocation of distal fracture fragments is noted. The ulna is unremarkable. IMPRESSION: Severely displaced and comminuted distal right radial fracture with posterior displacement of fracture fragments. Electronically Signed   By: Marijo Conception M.D.   On: 06/17/2020 14:43    ____________________________________________   PROCEDURES  Procedure(s) performed (including Critical Care):  Procedures   ____________________________________________   INITIAL IMPRESSION / ASSESSMENT AND PLAN / ED COURSE  As part of my medical decision making, I reviewed the following data within the [facility]        Patient has a displaced comminuted fracture distal right radius.  Discussed patient with on-call orthopedist Dr. Sabra Heck, who recommend closed reduction.  Procedural performed by Dr. Hulan Saas, my supervising physician. Patient was splinted and given discharge  care instruction.  Patient will follow up with orthopedic clinic by calling for an appointment on Monday morning.  Patient advised and addressed effects of medications.  Patient advised on on signs and symptoms of compartment syndrome.  Patient given discharge care instructions.  Return back to ED if condition worsens before seen orthopedics.     ____________________________________________   FINAL CLINICAL IMPRESSION(S) / ED DIAGNOSES  Final diagnoses:  Closed fracture of distal end of right radius, unspecified fracture morphology, initial encounter  Fall by pediatric patient, initial encounter  Fracture     ED Discharge Orders         Ordered    oxyCODONE-acetaminophen (PERCOCET) 7.5-325 MG tablet  Every 4 hours PRN        06/17/20 1812    ibuprofen (ADVIL) 600 MG tablet  Every 8 hours PRN        06/17/20 1812          *Please note:  Eleora Sutherland was evaluated in Emergency Department on 06/17/2020 for the symptoms described in the history of present illness. She was evaluated in the context of the global COVID-19 pandemic, which necessitated consideration that the patient might be at risk for infection with the SARS-CoV-2 virus that causes COVID-19. Institutional protocols and algorithms that pertain to the evaluation of patients at risk for COVID-19 are in a state of rapid change based on information released by regulatory bodies including the CDC and federal and state organizations. These policies and algorithms were followed during the patient's care in the ED.  Some ED evaluations and interventions may be delayed as a result of limited staffing during and the pandemic.*   Note:  This document was prepared using Dragon voice recognition software and may include unintentional dictation errors.    Sable Feil, PA-C 06/17/20 Lattie Corns    Lavonia Drafts, MD 06/18/20 1500

## 2020-06-17 NOTE — ED Provider Notes (Addendum)
Reduction of fracture  Date/Time: 06/17/2020 4:00 PM Performed by: Lucrezia Starch, MD Authorized by: Lucrezia Starch, MD  Consent: Verbal consent obtained. Consent given by: patient Patient understanding: patient states understanding of the procedure being performed Patient consent: the patient's understanding of the procedure matches consent given Patient identity confirmed: verbally with patient Preparation: Patient was prepped and draped in the usual sterile fashion. Local anesthesia used: yes Anesthesia: hematoma block  Anesthesia: Local anesthesia used: yes Local Anesthetic: bupivacaine 0.5% without epinephrine Anesthetic total: 5 mL  Sedation: Patient sedated: no  Patient tolerance: patient tolerated the procedure well with no immediate complications Comments: Of note this procedure did take multiple times.  I was able to mild decrease in foreshortening but there is still some slight misalignment.  We will plan for sugar tong splint and close Ortho follow-up.      Lucrezia Starch, MD 06/17/20 1601    Lucrezia Starch, MD 06/17/20 [phone]

## 2021-07-04 ENCOUNTER — Emergency Department
Admission: EM | Admit: 2021-07-04 | Discharge: 2021-07-04 | Disposition: A | Discharge status: Home / Self Care | Payer: Medicare Other | Attending: Emergency Medicine | Admitting: Emergency Medicine

## 2021-07-04 ENCOUNTER — Emergency Department: Payer: Medicare Other

## 2021-07-04 ENCOUNTER — Other Ambulatory Visit: Payer: Self-pay

## 2021-07-04 DIAGNOSIS — M7989 Other specified soft tissue disorders: Secondary | ICD-10-CM | POA: Insufficient documentation

## 2021-07-04 NOTE — ED Provider Notes (Signed)
[facility] Provider Note    Event Date/Time   First MD Initiated Contact with Patient 07/04/21 1203     (approximate)   History   Leg Swelling   HPI  Leslie Chavez is a 56 y.o. female with no significant past medical history who presents with right leg swelling.  This is been going on for about 2 weeks.  It is located throughout the right leg.  Patient notes that last week her knee was swollen she was having difficulty bending it but this is now resolved.  She denies any preceding trauma.  She denies associated significant pain is having some difficulty ambulating she thinks to the swelling not related to pain.  No history of DVT/PE.  She denies shortness of breath or abdominal pain.    History reviewed. No pertinent past medical history.  Patient Active Problem List   Diagnosis Date Noted   Screening for colon cancer      Physical Exam  Triage Vital Signs: ED Triage Vitals  Enc Vitals Group     BP 07/04/21 1159 135/70     Pulse Rate 07/04/21 1159 93     Resp 07/04/21 1159 17     Temp 07/04/21 1159 98.9 F (37.2 C)     Temp Source 07/04/21 1159 Oral     SpO2 07/04/21 1159 98 %     Weight --      Height 07/04/21 1200 5\' 1"  (1.549 m)     Head Circumference --      Peak Flow --      Pain Score 07/04/21 1200 3     Pain Loc --      Pain Edu? --      Excl. in [place]? --     Most recent vital signs: Vitals:   07/04/21 1159  BP: 135/70  Pulse: 93  Resp: 17  Temp: 98.9 F (37.2 C)  SpO2: 98%     General: Awake, no distress.  CV:  Good peripheral perfusion.  Resp:  Normal effort.  Abd:  No distention.  Neuro:             Awake, Alert, Oriented x 3  Other:  Right lower extremity is mildly larger than left, there is no focal tenderness along the left upper or lower leg, knee is nontender with normal range of motion, 2+ DP pulses bilaterally, compartments are soft, no pitting edema   ED Results / Procedures / Treatments  Labs (all labs  ordered are listed, but only abnormal results are displayed) Labs Reviewed - No data to display   EKG     RADIOLOGY  I reviewed the DVT study which I negative for DVT  PROCEDURES:  Critical Care performed: No    MEDICATIONS ORDERED IN ED: Medications - No data to display   IMPRESSION / MDM / [place] / ED COURSE  I reviewed the triage vital signs and the nursing notes.                              Differential diagnosis includes, but is not limited to, osteoarthritis of the knee, DVT, muscle strain  Patient is a 56 year old female presenting with right leg swelling x2 weeks.  On exam there is some mild swelling but I do not appreciate any edema tenderness in the compartments are soft she is neurovascular intact.  She has no history of DVT and is not having  any symptoms of PE including shortness of breath or chest pain.  DVT study was obtained which is negative.  I did advise her that if she continues to have swelling that she have a follow-up DVT study in about 1 week.        FINAL CLINICAL IMPRESSION(S) / ED DIAGNOSES   Final diagnoses:  Leg swelling     Rx / DC Orders   ED Discharge Orders     None        Note:  This document was prepared using Dragon voice recognition software and may include unintentional dictation errors.   [name], MD 07/04/21 1556

## 2021-07-04 NOTE — ED Triage Notes (Addendum)
Patient to ER [facility] for x2 weeks of right thigh/ upper leg swelling. Denies hx of blood clots. Reports working out at [employer] when she noticed the swelling. No redness noted. Denies injury or trauma.   Right thigh obviously swollen compared to left.   Reports recently having covid and having new medication changes for her tremors.

## 2021-07-04 NOTE — Discharge Instructions (Signed)
The ultrasound of your leg did not show any blood clot.  If you continue to have swelling you should have a repeat ultrasound done in about 1 week.  Please follow-up with your primary doctor.  In the meantime please elevate your leg while you are sitting to help with the swelling.

## 2021-07-16 ENCOUNTER — Encounter: Payer: Self-pay | Admitting: Emergency Medicine

## 2021-07-16 ENCOUNTER — Emergency Department
Admission: EM | Admit: 2021-07-16 | Discharge: 2021-07-16 | Disposition: A | Discharge status: Home / Self Care | Payer: Medicare Other | Attending: Emergency Medicine | Admitting: Emergency Medicine

## 2021-07-16 ENCOUNTER — Other Ambulatory Visit: Payer: Self-pay

## 2021-07-16 DIAGNOSIS — M79604 Pain in right leg: Secondary | ICD-10-CM | POA: Insufficient documentation

## 2021-07-16 DIAGNOSIS — M25561 Pain in right knee: Secondary | ICD-10-CM | POA: Diagnosis present

## 2021-07-16 MED ORDER — NAPROXEN 500 MG PO TABS: 500.0000 mg | ORAL_TABLET | Freq: Two times a day (BID) | ORAL | 2 refills | Status: DC

## 2021-07-16 NOTE — ED Notes (Signed)
First Nurse Note:  Pt ambulatory into ED c/o right leg pain. Pt is in NAD. ?

## 2021-07-16 NOTE — ED Provider Triage Note (Signed)
Emergency Medicine Provider Triage Evaluation Note ? ?Leslie Chavez , a 56 y.o. female  was evaluated in triage.  Pt complains of ongoing right knee pain and swelling. No injury. Pain is worse behind the kneecap at night. . ? ?Review of Systems  ?Positive: Right knee pain ?Negative: Numbness ? ?Physical Exam  ?Ht '5\' 1"'$  (1.549 m)   Wt 93.9 kg   BMI 39.11 kg/m?  ?Gen:   Awake, no distress   ?Resp:  Normal effort  ?MSK:   Moves extremities without difficulty  ?Other:   ? ?Medical Decision Making  ?Medically screening exam initiated at 11:07 AM.  Appropriate orders placed.  Leslie Chavez was informed that the remainder of the evaluation will be completed by another provider, this initial triage assessment does not replace that evaluation, and the importance of remaining in the ED until their evaluation is complete. ?  ?Victorino Dike, FNP ?07/16/21 1109 ? ?

## 2021-07-16 NOTE — ED Provider Notes (Signed)
? ?  [facility] ?Provider Note ? ? ? Event Date/Time  ? First MD Initiated Contact with Patient 07/16/21 1115   ?  (approximate) ? ? ?History  ? ?Knee Pain ? ? ?HPI ? ?Leslie Chavez is a 56 y.o. female who presents with complaints of right knee pain.  Patient reports this is been going on for several weeks.  Was seen in the emergency department recently and had negative DVT study.  Has not followed up with orthopedics.  He is not taking anything for this.  Denies fevers or chills.  No fall or other injury reported.  Pain in the anterior and posterior knee.  No swelling ?  ? ? ?Physical Exam  ? ?Triage Vital Signs: ?ED Triage Vitals  ?Enc Vitals Group  ?   BP 07/16/21 1107 131/72  ?   Pulse Rate 07/16/21 1107 80  ?   Resp 07/16/21 1107 16  ?   Temp 07/16/21 1107 98.5 ?F (36.9 ?C)  ?   Temp Source 07/16/21 1107 Oral  ?   SpO2 07/16/21 1107 98 %  ?   Weight 07/16/21 1105 93.9 kg (207 lb)  ?   Height 07/16/21 1105 1.549 m ('5\' 1"'$ )  ?   Head Circumference --   ?   Peak Flow --   ?   Pain Score 07/16/21 1105 1  ?   Pain Loc --   ?   Pain Edu? --   ?   Excl. in [place]? --   ? ? ?Most recent vital signs: ?Vitals:  ? 07/16/21 1107 07/16/21 1144  ?BP: 131/72 128/76  ?Pulse: 80 80  ?Resp: 16 17  ?Temp: 98.5 ?F (36.9 ?C)   ?SpO2: 98% 97%  ? ? ? ?General: Awake, no distress.  ?CV:  Good peripheral perfusion.  ?Resp:  Normal effort.  ?Abd:  No distention.  ?Other:  Right leg: No knee effusion, no calf pain or swelling, warm and well perfused, normal range of motion, ambulates well ? ? ?ED Results / Procedures / Treatments  ? ?Labs ?(all labs ordered are listed, but only abnormal results are displayed) ?Labs Reviewed - No data to display ? ? ?EKG ? ? ? ? ?RADIOLOGY ? ? ? ? ?PROCEDURES: ? ?Critical Care performed:  ? ?Procedures ? ? ?MEDICATIONS ORDERED IN ED: ?Medications - No data to display ? ? ?IMPRESSION / MDM / ASSESSMENT AND PLAN / ED COURSE  ?I reviewed the triage vital signs and the nursing  notes. ? ?Patient presents with continued pain in the right knee, suspect arthritis, reviewed ultrasound performed on 22 February which was unremarkable ? ?We will start the patient on naproxen and have her follow-up with orthopedics for further evaluation ? ? ? ? ? ?  ? ? ?FINAL CLINICAL IMPRESSION(S) / ED DIAGNOSES  ? ?Final diagnoses:  ?Acute leg pain, right  ? ? ? ?Rx / DC Orders  ? ?ED Discharge Orders   ? ?      Ordered  ?  naproxen (NAPROSYN) 500 MG tablet  2 times daily with meals       ? 07/16/21 1139  ? ?  ?  ? ?  ? ? ? ?Note:  This document was prepared using Dragon voice recognition software and may include unintentional dictation errors. ?  ?Lavonia Drafts, MD ?07/16/21 1552 ? ?

## 2021-07-16 NOTE — ED Triage Notes (Signed)
Pt via POV from home. Pt c/o R knee pain for a couple of weeks. States she was here recently for the same. Denies any injury. Some swelling noted to L knee. Ambulatory to triage. Denies hx of arthritis.  Pt is A&Ox4 and NAD.  ?

## 2022-02-01 IMAGING — MG DIGITAL SCREENING BILAT W/ TOMO W/ CAD
6 of 10 series · 6 of 30 positions shown · non-contrast
Comparison: Previous exam(s).

CLINICAL DATA: Screening.

EXAM:
DIGITAL SCREENING BILATERAL MAMMOGRAM WITH TOMO AND CAD

[L MLO synth-2D]
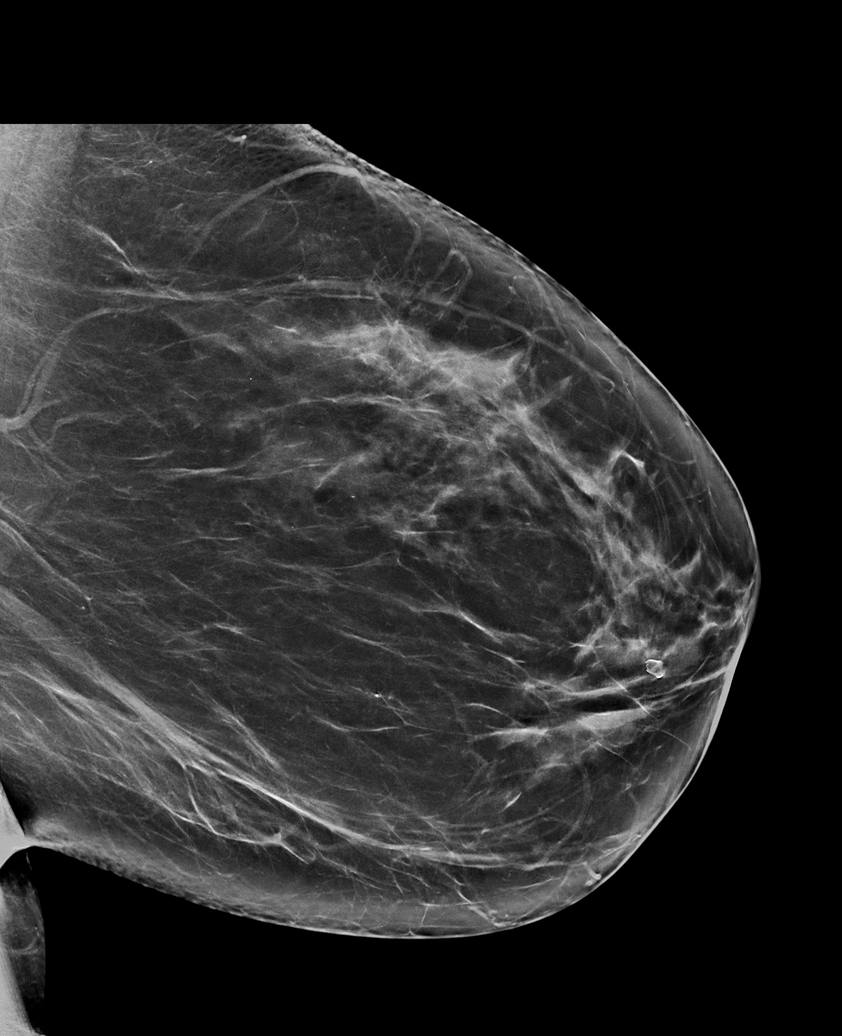

[R CC synth-2D]
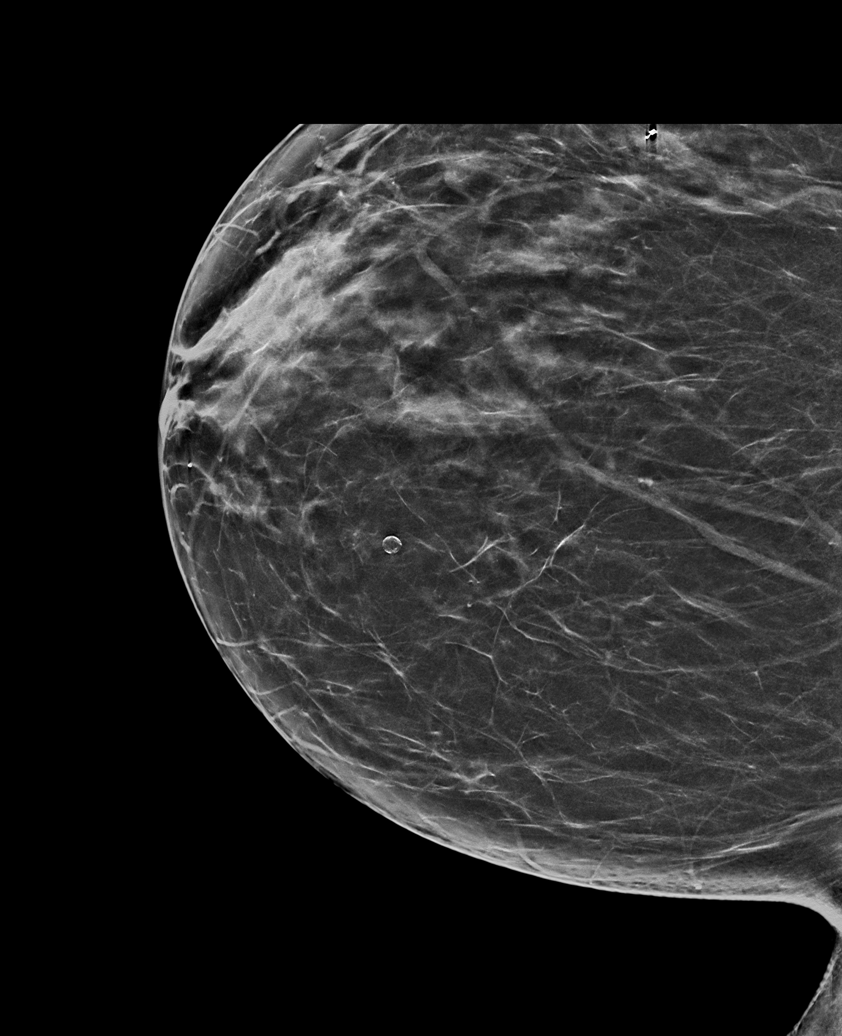

[R XCCL synth-2D]
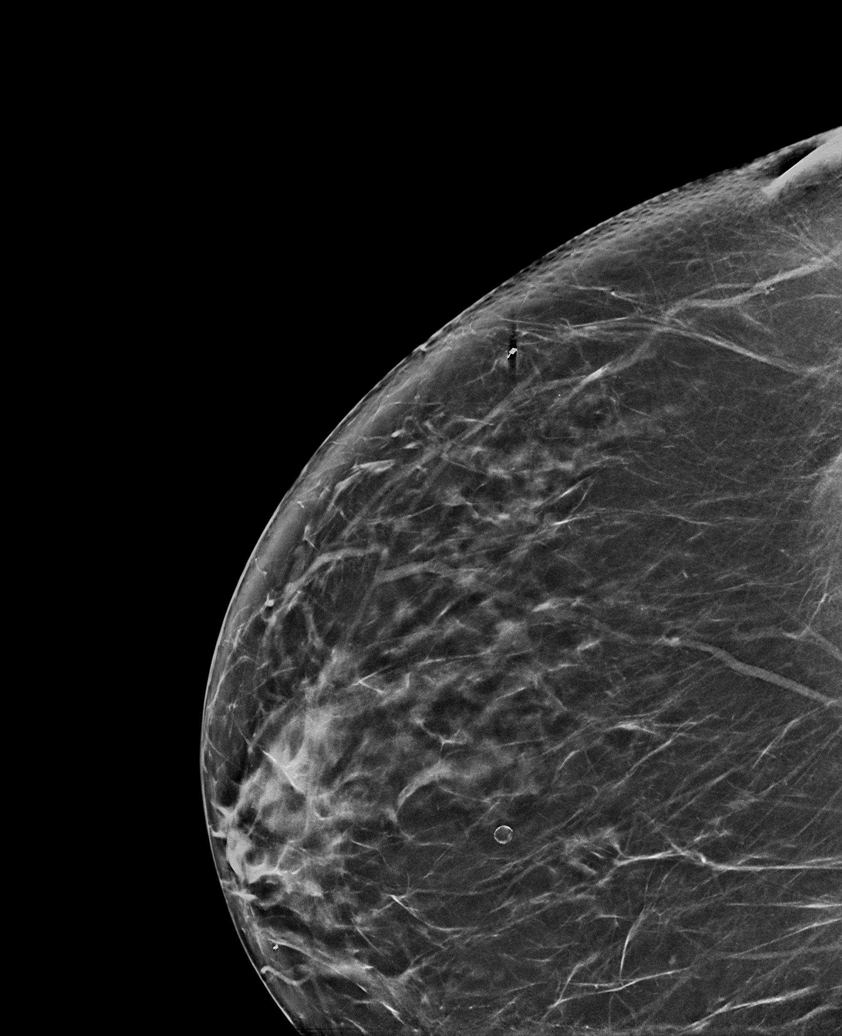

[R MLO synth-2D]
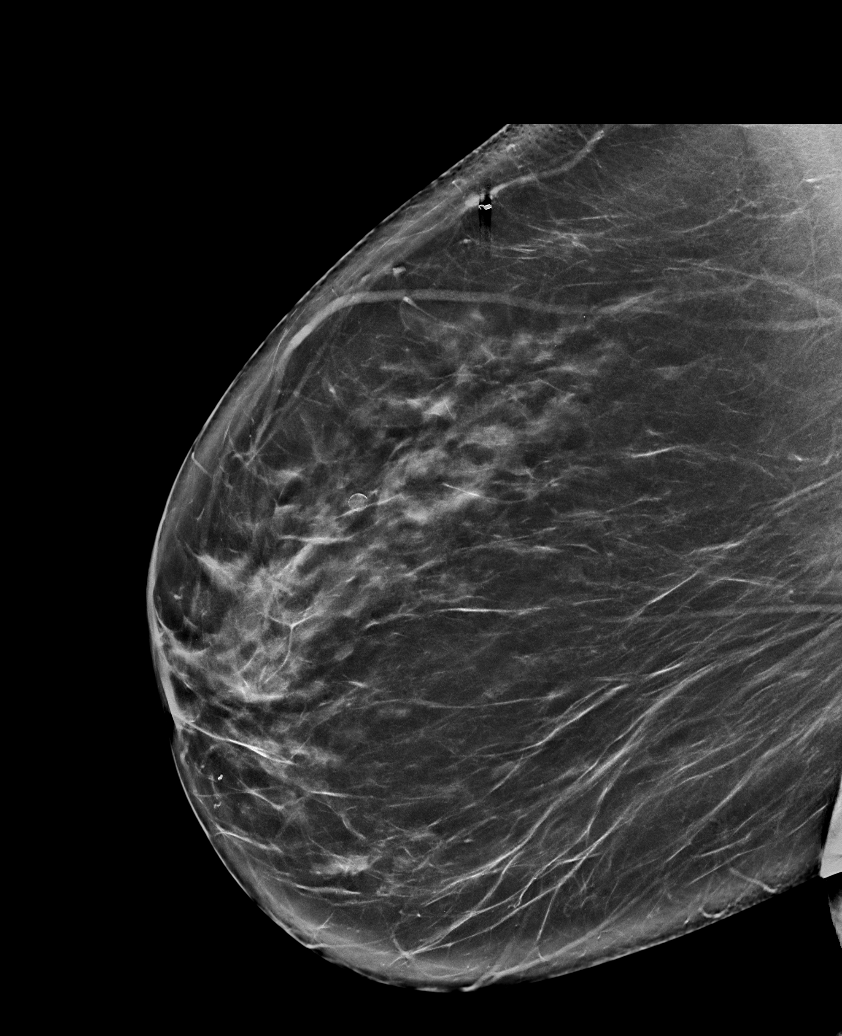

[L CC synth-2D]
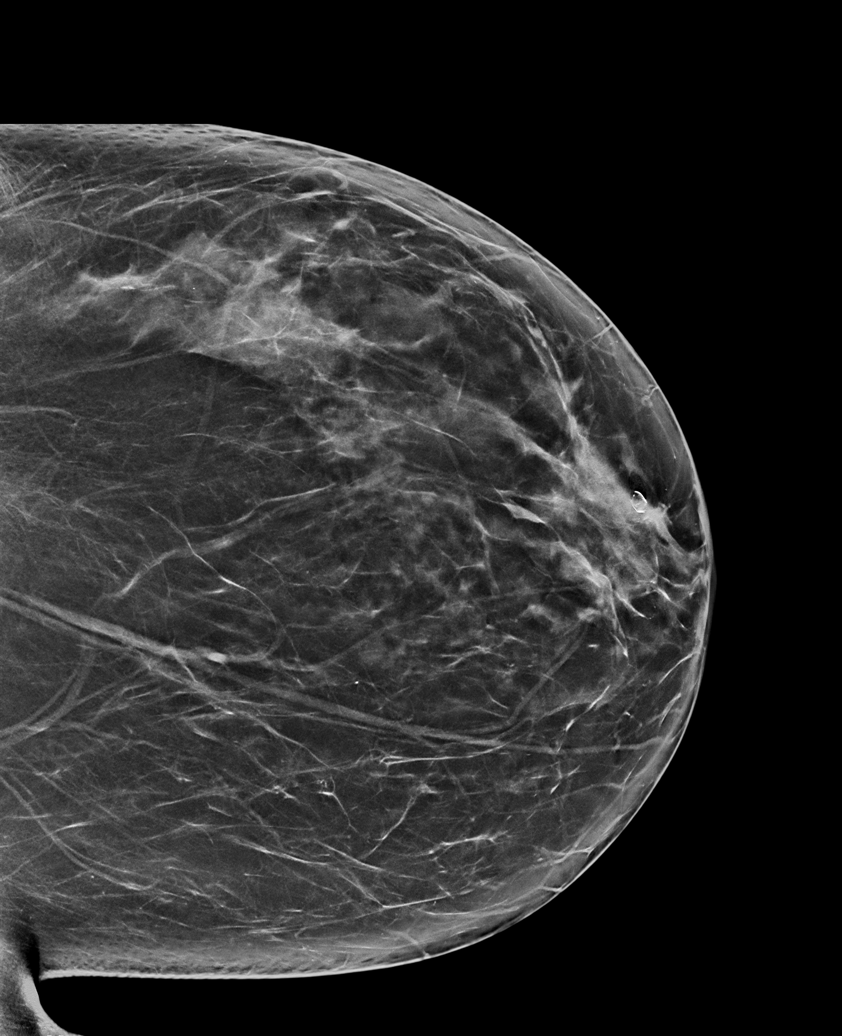

[L MLO tomo · tomo slice 44/87.0]
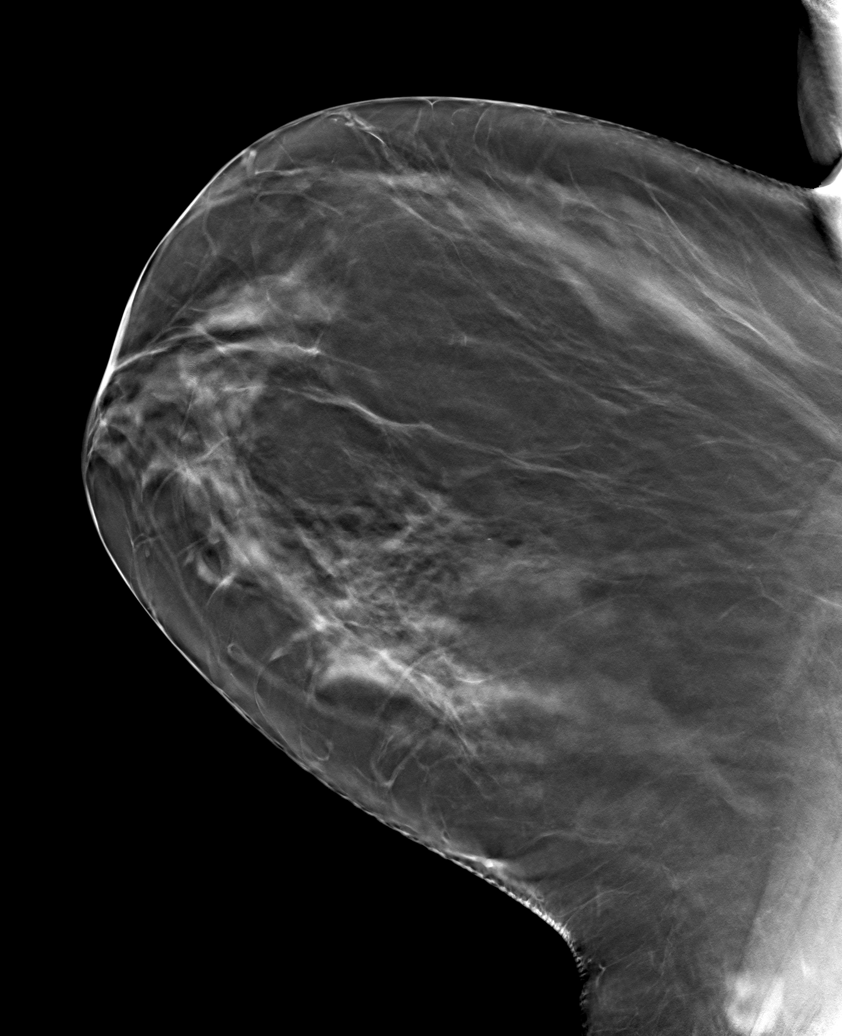

[6 of 30 positions shown; findings below may reference images not displayed]

ACR Breast Density Category c: The breast tissue is heterogeneously
dense, which may obscure small masses.
FINDINGS: There are no findings suspicious for malignancy. Images were
processed with CAD.
IMPRESSION: No mammographic evidence of malignancy. A result letter of this
screening mammogram will be mailed directly to the patient.

RECOMMENDATION:
Screening mammogram in one year. (Code:FT-U-LHB)

BI-RADS CATEGORY  1: Negative.

## 2022-02-06 ENCOUNTER — Other Ambulatory Visit: Payer: Self-pay | Admitting: Family Medicine

## 2022-02-06 DIAGNOSIS — R2241 Localized swelling, mass and lump, right lower limb: Secondary | ICD-10-CM

## 2022-02-20 ENCOUNTER — Ambulatory Visit
Admission: RE | Admit: 2022-02-20 | Discharge: 2022-02-20 | Disposition: A | Discharge status: Home / Self Care | Payer: Medicare Other | Source: Ambulatory Visit | Attending: Family Medicine | Admitting: Family Medicine

## 2022-02-20 ENCOUNTER — Other Ambulatory Visit: Payer: Self-pay | Admitting: Family Medicine

## 2022-02-20 DIAGNOSIS — R2241 Localized swelling, mass and lump, right lower limb: Secondary | ICD-10-CM | POA: Diagnosis present

## 2022-03-15 ENCOUNTER — Other Ambulatory Visit: Payer: Self-pay | Admitting: Family Medicine

## 2022-03-15 DIAGNOSIS — Z1231 Encounter for screening mammogram for malignant neoplasm of breast: Secondary | ICD-10-CM

## 2022-05-02 ENCOUNTER — Ambulatory Visit
Admission: RE | Admit: 2022-05-02 | Discharge: 2022-05-02 | Disposition: A | Discharge status: Home / Self Care | Payer: Medicare Other | Source: Ambulatory Visit | Attending: Family Medicine | Admitting: Family Medicine

## 2022-05-02 DIAGNOSIS — Z1231 Encounter for screening mammogram for malignant neoplasm of breast: Secondary | ICD-10-CM | POA: Insufficient documentation

## 2023-07-22 ENCOUNTER — Other Ambulatory Visit: Payer: Self-pay | Admitting: Family Medicine

## 2023-07-22 DIAGNOSIS — Z1231 Encounter for screening mammogram for malignant neoplasm of breast: Secondary | ICD-10-CM

## 2023-12-03 ENCOUNTER — Other Ambulatory Visit: Payer: Self-pay

## 2023-12-03 DIAGNOSIS — K8013 Calculus of gallbladder with acute and chronic cholecystitis with obstruction: Secondary | ICD-10-CM | POA: Diagnosis present

## 2023-12-03 DIAGNOSIS — Z87442 Personal history of urinary calculi: Secondary | ICD-10-CM | POA: Insufficient documentation

## 2023-12-03 NOTE — ED Triage Notes (Signed)
 Pt to ED via EMS from home, pt repots she developed RLQ abd pain tonight pt reports she has also had inc in feeling gassy. Pt denies n/v/d

## 2023-12-04 ENCOUNTER — Ambulatory Visit
Admission: EM | Admit: 2023-12-04 | Discharge: 2023-12-04 | Disposition: A | Discharge status: Home / Self Care | Attending: Emergency Medicine | Admitting: Emergency Medicine

## 2023-12-04 ENCOUNTER — Emergency Department

## 2023-12-04 ENCOUNTER — Emergency Department: Admitting: Anesthesiology

## 2023-12-04 ENCOUNTER — Other Ambulatory Visit: Payer: Self-pay

## 2023-12-04 ENCOUNTER — Encounter
Admission: EM | Disposition: A | Discharge status: Home / Self Care | Payer: Self-pay | Source: Home / Self Care | Attending: Emergency Medicine

## 2023-12-04 DIAGNOSIS — K8010 Calculus of gallbladder with chronic cholecystitis without obstruction: Secondary | ICD-10-CM

## 2023-12-04 DIAGNOSIS — K8020 Calculus of gallbladder without cholecystitis without obstruction: Secondary | ICD-10-CM

## 2023-12-04 DIAGNOSIS — K8000 Calculus of gallbladder with acute cholecystitis without obstruction: Secondary | ICD-10-CM

## 2023-12-04 DIAGNOSIS — K8013 Calculus of gallbladder with acute and chronic cholecystitis with obstruction: Secondary | ICD-10-CM | POA: Diagnosis not present

## 2023-12-04 HISTORY — DX: Calculus of gallbladder with acute cholecystitis without obstruction: K80.00

## 2023-12-04 LAB — URINALYSIS, ROUTINE W REFLEX MICROSCOPIC
Bacteria, UA: NONE SEEN
Bilirubin Urine: NEGATIVE
Glucose, UA: NEGATIVE mg/dL
Ketones, ur: NEGATIVE mg/dL
Leukocytes,Ua: NEGATIVE
Nitrite: NEGATIVE
Protein, ur: NEGATIVE mg/dL
Specific Gravity, Urine: 1.023 (ref 1.005–1.030)
pH: 5 (ref 5.0–8.0)

## 2023-12-04 LAB — CBC
HCT: 42.8 % (ref 36.0–46.0)
Hemoglobin: 13.9 g/dL (ref 12.0–15.0)
MCH: 29.9 pg (ref 26.0–34.0)
MCHC: 32.5 g/dL (ref 30.0–36.0)
MCV: 92 fL (ref 80.0–100.0)
Platelets: 265 K/uL (ref 150–400)
RBC: 4.65 MIL/uL (ref 3.87–5.11)
RDW: 13.4 % (ref 11.5–15.5)
WBC: 8.9 K/uL (ref 4.0–10.5)
nRBC: 0 % (ref 0.0–0.2)

## 2023-12-04 LAB — COMPREHENSIVE METABOLIC PANEL WITH GFR
ALT: 12 U/L (ref 0–44)
AST: 16 U/L (ref 15–41)
Albumin: 3.5 g/dL (ref 3.5–5.0)
Alkaline Phosphatase: 73 U/L (ref 38–126)
Anion gap: 10 (ref 5–15)
BUN: 14 mg/dL (ref 6–20)
CO2: 21 mmol/L — ABNORMAL LOW (ref 22–32)
Calcium: 9.1 mg/dL (ref 8.9–10.3)
Chloride: 110 mmol/L (ref 98–111)
Creatinine, Ser: 1.06 mg/dL — ABNORMAL HIGH (ref 0.44–1.00)
GFR, Estimated: >60 mL/min (ref >60)
Glucose, Bld: 126 mg/dL — ABNORMAL HIGH (ref 70–99)
Potassium: 3.9 mmol/L (ref 3.5–5.1)
Sodium: 141 mmol/L (ref 135–145)
Total Bilirubin: 0.7 mg/dL (ref 0.0–1.2)
Total Protein: 6.7 g/dL (ref 6.5–8.1)

## 2023-12-04 LAB — LIPASE, BLOOD: Lipase: 39 U/L (ref 11–51)

## 2023-12-04 SURGERY — CHOLECYSTECTOMY, ROBOT-ASSISTED, LAPAROSCOPIC
Anesthesia: General

## 2023-12-04 MED ORDER — GABAPENTIN 300 MG PO CAPS
300.0000 mg | ORAL_CAPSULE | ORAL | Status: AC
  Administered 2023-12-04: 300 mg via ORAL

## 2023-12-04 MED ORDER — INDOCYANINE GREEN 25 MG IV SOLR
1.2500 mg | Freq: Once | INTRAVENOUS | Status: AC
  Administered 2023-12-04: 1.25 mg via INTRAVENOUS

## 2023-12-04 MED ORDER — MORPHINE SULFATE (PF) 4 MG/ML IV SOLN
4.0000 mg | Freq: Once | INTRAVENOUS | Status: AC
  Administered 2023-12-04: 4 mg via INTRAVENOUS
  Filled 2023-12-04: qty 1

## 2023-12-04 MED ORDER — BUPIVACAINE LIPOSOME 1.3 % IJ SUSP: 20.0000 mL | Freq: Once | INTRAMUSCULAR | Status: DC

## 2023-12-04 MED ORDER — BUPIVACAINE-EPINEPHRINE (PF) 0.25% -1:200000 IJ SOLN
INTRAMUSCULAR | Status: AC
  Filled 2023-12-04: qty 30

## 2023-12-04 MED ORDER — SODIUM CHLORIDE 0.9 % IV SOLN: INTRAVENOUS | Status: DC

## 2023-12-04 MED ORDER — FENTANYL CITRATE (PF) 100 MCG/2ML IJ SOLN
INTRAMUSCULAR | Status: DC | PRN
  Administered 2023-12-04: 100 ug via INTRAVENOUS

## 2023-12-04 MED ORDER — LACTATED RINGERS IV SOLN: INTRAVENOUS | Status: DC

## 2023-12-04 MED ORDER — INDOCYANINE GREEN 25 MG IV SOLR
INTRAVENOUS | Status: AC
  Filled 2023-12-04: qty 10

## 2023-12-04 MED ORDER — KETAMINE HCL 50 MG/5ML IJ SOSY
PREFILLED_SYRINGE | INTRAMUSCULAR | Status: AC
  Filled 2023-12-04: qty 5

## 2023-12-04 MED ORDER — CHLORHEXIDINE GLUCONATE 0.12 % MT SOLN
OROMUCOSAL | Status: AC
  Filled 2023-12-04: qty 15

## 2023-12-04 MED ORDER — BUPIVACAINE-EPINEPHRINE 0.25% -1:200000 IJ SOLN
INTRAMUSCULAR | Status: DC | PRN
  Administered 2023-12-04: 20 mL
  Administered 2023-12-04: 30 mL

## 2023-12-04 MED ORDER — OXYCODONE HCL 5 MG PO TABS
ORAL_TABLET | ORAL | Status: AC
Start: 2023-12-04 — End: 2023-12-04
  Filled 2023-12-04: qty 1

## 2023-12-04 MED ORDER — KETOROLAC TROMETHAMINE 30 MG/ML IJ SOLN
30.0000 mg | Freq: Once | INTRAMUSCULAR | Status: AC
  Administered 2023-12-04: 30 mg via INTRAVENOUS
  Filled 2023-12-04: qty 1

## 2023-12-04 MED ORDER — HYDROCODONE-ACETAMINOPHEN 5-325 MG PO TABS: 1.0000 | ORAL_TABLET | Freq: Four times a day (QID) | ORAL | 0 refills | Status: AC | PRN

## 2023-12-04 MED ORDER — IOHEXOL 300 MG/ML  SOLN
100.0000 mL | Freq: Once | INTRAMUSCULAR | Status: AC | PRN
  Administered 2023-12-04: 100 mL via INTRAVENOUS

## 2023-12-04 MED ORDER — FENTANYL CITRATE (PF) 100 MCG/2ML IJ SOLN
INTRAMUSCULAR | Status: AC
Start: 2023-12-04 — End: 2023-12-04
  Filled 2023-12-04: qty 2

## 2023-12-04 MED ORDER — 0.9 % SODIUM CHLORIDE (POUR BTL) OPTIME
TOPICAL | Status: DC | PRN
  Administered 2023-12-04: 500 mL

## 2023-12-04 MED ORDER — CHLORHEXIDINE GLUCONATE 0.12 % MT SOLN
15.0000 mL | Freq: Once | OROMUCOSAL | Status: AC
  Administered 2023-12-04: 15 mL via OROMUCOSAL

## 2023-12-04 MED ORDER — GABAPENTIN 300 MG PO CAPS
ORAL_CAPSULE | ORAL | Status: AC
  Filled 2023-12-04: qty 1

## 2023-12-04 MED ORDER — ROCURONIUM BROMIDE 100 MG/10ML IV SOLN
INTRAVENOUS | Status: DC | PRN
  Administered 2023-12-04: 60 mg via INTRAVENOUS

## 2023-12-04 MED ORDER — OXYCODONE HCL 5 MG/5ML PO SOLN: 5.0000 mg | Freq: Once | ORAL | Status: AC | PRN

## 2023-12-04 MED ORDER — ACETAMINOPHEN 500 MG PO TABS
ORAL_TABLET | ORAL | Status: AC
  Filled 2023-12-04: qty 2

## 2023-12-04 MED ORDER — PROPOFOL 10 MG/ML IV BOLUS
INTRAVENOUS | Status: DC | PRN
  Administered 2023-12-04: 30 mg via INTRAVENOUS
  Administered 2023-12-04: 150 mg via INTRAVENOUS

## 2023-12-04 MED ORDER — FENTANYL CITRATE (PF) 100 MCG/2ML IJ SOLN
INTRAMUSCULAR | Status: AC
  Filled 2023-12-04: qty 2

## 2023-12-04 MED ORDER — SODIUM CHLORIDE 0.9 % IV BOLUS (SEPSIS)
1000.0000 mL | Freq: Once | INTRAVENOUS | Status: AC
  Administered 2023-12-04: 1000 mL via INTRAVENOUS

## 2023-12-04 MED ORDER — FENTANYL CITRATE (PF) 100 MCG/2ML IJ SOLN
25.0000 ug | INTRAMUSCULAR | Status: DC | PRN
  Administered 2023-12-04: 25 ug via INTRAVENOUS

## 2023-12-04 MED ORDER — CELECOXIB 200 MG PO CAPS
ORAL_CAPSULE | ORAL | Status: AC
  Filled 2023-12-04: qty 1

## 2023-12-04 MED ORDER — CHLORHEXIDINE GLUCONATE CLOTH 2 % EX PADS: 6.0000 | MEDICATED_PAD | Freq: Once | CUTANEOUS | Status: DC

## 2023-12-04 MED ORDER — BUPIVACAINE LIPOSOME 1.3 % IJ SUSP
INTRAMUSCULAR | Status: AC
  Filled 2023-12-04: qty 20

## 2023-12-04 MED ORDER — CEFAZOLIN SODIUM-DEXTROSE 2-4 GM/100ML-% IV SOLN
INTRAVENOUS | Status: AC
  Filled 2023-12-04: qty 100

## 2023-12-04 MED ORDER — SUGAMMADEX SODIUM 200 MG/2ML IV SOLN
INTRAVENOUS | Status: DC | PRN
Start: 2023-12-04 — End: 2023-12-04
  Administered 2023-12-04: 200 mg via INTRAVENOUS

## 2023-12-04 MED ORDER — DROPERIDOL 2.5 MG/ML IJ SOLN
0.6250 mg | Freq: Once | INTRAMUSCULAR | Status: AC
  Administered 2023-12-04: 0.625 mg via INTRAVENOUS

## 2023-12-04 MED ORDER — ONDANSETRON HCL 4 MG/2ML IJ SOLN
4.0000 mg | Freq: Once | INTRAMUSCULAR | Status: AC
Start: 2023-12-04 — End: 2023-12-04
  Administered 2023-12-04: 4 mg via INTRAVENOUS
  Filled 2023-12-04: qty 2

## 2023-12-04 MED ORDER — CEFAZOLIN SODIUM-DEXTROSE 2-4 GM/100ML-% IV SOLN
2.0000 g | INTRAVENOUS | Status: AC
  Administered 2023-12-04: 2 g via INTRAVENOUS

## 2023-12-04 MED ORDER — ACETAMINOPHEN 500 MG PO TABS
1000.0000 mg | ORAL_TABLET | ORAL | Status: AC
  Administered 2023-12-04: 1000 mg via ORAL

## 2023-12-04 MED ORDER — KETAMINE HCL 50 MG/5ML IJ SOSY
PREFILLED_SYRINGE | INTRAMUSCULAR | Status: DC | PRN
Start: 2023-12-04 — End: 2023-12-04
  Administered 2023-12-04: 30 mg via INTRAVENOUS
  Administered 2023-12-04: 20 mg via INTRAVENOUS

## 2023-12-04 MED ORDER — CELECOXIB 200 MG PO CAPS
200.0000 mg | ORAL_CAPSULE | ORAL | Status: AC
  Administered 2023-12-04: 200 mg via ORAL

## 2023-12-04 MED ORDER — MIDAZOLAM HCL 2 MG/2ML IJ SOLN
INTRAMUSCULAR | Status: DC | PRN
  Administered 2023-12-04: 2 mg via INTRAVENOUS

## 2023-12-04 MED ORDER — MIDAZOLAM HCL 2 MG/2ML IJ SOLN
INTRAMUSCULAR | Status: AC
  Filled 2023-12-04: qty 2

## 2023-12-04 MED ORDER — LIDOCAINE HCL (CARDIAC) PF 100 MG/5ML IV SOSY
PREFILLED_SYRINGE | INTRAVENOUS | Status: DC | PRN
  Administered 2023-12-04: 100 mg via INTRAVENOUS

## 2023-12-04 MED ORDER — DROPERIDOL 2.5 MG/ML IJ SOLN
INTRAMUSCULAR | Status: AC
Start: 2023-12-04 — End: 2023-12-04
  Filled 2023-12-04: qty 2

## 2023-12-04 MED ORDER — ONDANSETRON HCL 4 MG/2ML IJ SOLN
INTRAMUSCULAR | Status: DC | PRN
  Administered 2023-12-04: 4 mg via INTRAVENOUS

## 2023-12-04 MED ORDER — OXYCODONE HCL 5 MG PO TABS
5.0000 mg | ORAL_TABLET | Freq: Once | ORAL | Status: AC | PRN
  Administered 2023-12-04: 5 mg via ORAL

## 2023-12-04 SURGICAL SUPPLY — 39 items
BAG PRESSURE INF REUSE 3000 (BAG) IMPLANT
CLIP APPLIE XI LRG REUSE DVNC (INSTRUMENTS) IMPLANT
CLIP LIGATING HEM O LOK PURPLE (MISCELLANEOUS) ×1 IMPLANT
COVER TIP SHEARS 8 DVNC (MISCELLANEOUS) ×1 IMPLANT
DEFOGGER SCOPE WARM SEASHARP (MISCELLANEOUS) ×1 IMPLANT
DERMABOND ADVANCED .7 DNX12 (GAUZE/BANDAGES/DRESSINGS) ×1 IMPLANT
DRAPE ARM DVNC X/XI (DISPOSABLE) ×4 IMPLANT
DRAPE COLUMN DVNC XI (DISPOSABLE) ×1 IMPLANT
FORCEPS BPLR R/ABLATION 8 DVNC (INSTRUMENTS) ×1 IMPLANT
FORCEPS PROGRASP DVNC XI (FORCEP) ×1 IMPLANT
GLOVE ORTHO TXT STRL SZ7.5 (GLOVE) ×2 IMPLANT
GOWN STRL REUS W/ TWL LRG LVL3 (GOWN DISPOSABLE) ×2 IMPLANT
GOWN STRL REUS W/ TWL XL LVL3 (GOWN DISPOSABLE) ×2 IMPLANT
GRASPER SUT TROCAR 14GX15 (MISCELLANEOUS) ×1 IMPLANT
IRRIGATION STRYKERFLOW (MISCELLANEOUS) IMPLANT
IRRIGATOR SUCT 8 DISP DVNC XI (IRRIGATION / IRRIGATOR) IMPLANT
KIT PINK PAD W/HEAD ARM REST (MISCELLANEOUS) ×1 IMPLANT
KIT TURNOVER KIT A (KITS) ×1 IMPLANT
LABEL OR SOLS (LABEL) ×1 IMPLANT
MANIFOLD NEPTUNE II (INSTRUMENTS) ×1 IMPLANT
NDL HYPO 22X1.5 SAFETY MO (MISCELLANEOUS) ×1 IMPLANT
NDL INSUFFLATION 14GA 120MM (NEEDLE) ×1 IMPLANT
NEEDLE HYPO 22X1.5 SAFETY MO (MISCELLANEOUS) ×1 IMPLANT
NEEDLE INSUFFLATION 14GA 120MM (NEEDLE) ×1 IMPLANT
NS IRRIG 500ML POUR BTL (IV SOLUTION) ×1 IMPLANT
PACK LAP CHOLECYSTECTOMY (MISCELLANEOUS) ×1 IMPLANT
SCISSORS MNPLR CVD DVNC XI (INSTRUMENTS) ×1 IMPLANT
SEAL UNIV 5-12 XI (MISCELLANEOUS) ×4 IMPLANT
SET TUBE SMOKE EVAC HIGH FLOW (TUBING) ×1 IMPLANT
SOL .9 NS 3000ML IRR UROMATIC (IV SOLUTION) IMPLANT
SOLUTION ELECTROSURG ANTI STCK (MISCELLANEOUS) ×1 IMPLANT
SPIKE FLUID TRANSFER (MISCELLANEOUS) ×1 IMPLANT
SUT VICRYL 0 UR6 27IN ABS (SUTURE) ×1 IMPLANT
SUTURE MNCRL 4-0 27XMF (SUTURE) ×1 IMPLANT
SYRINGE TOOMEY IRRIG 70ML (MISCELLANEOUS) IMPLANT
SYSTEM BAG RETRIEVAL 10MM (BASKET) ×1 IMPLANT
TRAP FLUID SMOKE EVACUATOR (MISCELLANEOUS) ×1 IMPLANT
TROCAR Z-THREAD FIOS 11X100 BL (TROCAR) ×1 IMPLANT
WATER STERILE IRR 500ML POUR (IV SOLUTION) ×1 IMPLANT

## 2023-12-04 NOTE — Anesthesia Preprocedure Evaluation (Addendum)
 Anesthesia Evaluation  Patient identified by MRN, date of birth, ID band Patient awake    Reviewed: Allergy & Precautions, H&P , NPO status , Patient's Chart, lab work & pertinent test results, reviewed documented beta blocker date and time   History of Anesthesia Complications Negative for: history of anesthetic complications  Airway Mallampati: II  TM Distance: >3 FB Neck ROM: full    Dental no notable dental hx. (+) Partial Upper, Dental Advidsory Given, Missing, Poor Dentition   Pulmonary neg pulmonary ROS, former smoker   Pulmonary exam normal        Cardiovascular Exercise Tolerance: Good negative cardio ROS Normal cardiovascular exam     Neuro/Psych negative neurological ROS  negative psych ROS   GI/Hepatic negative GI ROS, Neg liver ROS,,,  Endo/Other  negative endocrine ROS  Class 3 obesity  Renal/GU      Musculoskeletal   Abdominal   Peds  Hematology negative hematology ROS (+)   Anesthesia Other Findings  acute cholecystitis  Past Surgical History: No date: BREAST BIOPSY; Right     Comment:  03/07/2015, neg 07/23/2019: COLONOSCOPY WITH PROPOFOL ; N/A     Comment:  Procedure: COLONOSCOPY WITH PROPOFOL ;  Surgeon: Unk Corinn Skiff, MD;  Location: [facility] ENDOSCOPY;  Service:               Gastroenterology;  Laterality: N/A;  BMI    Body Mass Index: 41.57 kg/m      Reproductive/Obstetrics negative OB ROS                              Anesthesia Physical Anesthesia Plan  ASA: 3  Anesthesia Plan: General ETT   Post-op Pain Management: Toradol  IV (intra-op)* and Ofirmev  IV (intra-op)*   Induction: Intravenous  PONV Risk Score and Plan: 4 or greater and Ondansetron , Dexamethasone and Midazolam   Airway Management Planned: Oral ETT  Additional Equipment:   Intra-op Plan:   Post-operative Plan: Extubation in OR  Informed Consent: I have reviewed the  patients History and Physical, chart, labs and discussed the procedure including the risks, benefits and alternatives for the proposed anesthesia with the patient or authorized representative who has indicated his/her understanding and acceptance.     Dental Advisory Given  Plan Discussed with: CRNA and Surgeon  Anesthesia Plan Comments: (Patient consented for risks of anesthesia including but not limited to:  - adverse reactions to medications - damage to eyes, teeth, lips or other oral mucosa - nerve damage due to positioning  - sore throat or hoarseness - Damage to heart, brain, nerves, lungs, other parts of body or loss of life  Patient voiced understanding and assent.)         Anesthesia Quick Evaluation

## 2023-12-04 NOTE — ED Notes (Signed)
 Pt to [place].

## 2023-12-04 NOTE — H&P (Signed)
 Patient ID: Leslie Chavez, female   DOB: 09-Sep-1965, 58 y.o.   MRN: [id number]  Chief Complaint: Right-sided abdominal pain  History of Present Illness Leslie Chavez is a 58 y.o. female with acute onset of right-sided abdominal pain beginning after a meal including meat sauce/sausage?.  She trialed Pepto-Bismol to help with the pain, vomited it.  The pain is in the right flank, radiated to the central abdomen, but also radiated to the back.  This was different from her usual gas bubble which passes after meals.  She has had some previous history of kidney stones and realized this was not recurring pain from this.  Presented to the ED after calling the ambulance at 11 PM last night.  Past Medical History History reviewed. No pertinent past medical history.    Past Surgical History:  Procedure Laterality Date   BREAST BIOPSY Right    03/07/2015, neg   COLONOSCOPY WITH PROPOFOL  N/A 07/23/2019   Procedure: COLONOSCOPY WITH PROPOFOL ;  Surgeon: Unk Corinn Skiff, MD;  Location: [facility] ENDOSCOPY;  Service: Gastroenterology;  Laterality: N/A;    Allergies  Allergen Reactions   Sulfa Antibiotics Hives    Current Facility-Administered Medications  Medication Dose Route Frequency Provider Last Rate Last Admin   0.9 %  sodium chloride  infusion   Intravenous Continuous Ward, Josette SAILOR, DO 125 mL/hr at 12/04/23 0659 New Bag at 12/04/23 0659   acetaminophen  (TYLENOL ) tablet 1,000 mg  1,000 mg Oral On Call to OR Lane Shope, MD       bupivacaine  liposome (EXPAREL ) 1.3 % injection 266 mg  20 mL Infiltration Once Amesha Bailey, MD       ceFAZolin  (ANCEF ) IVPB 2g/100 mL premix  2 g Intravenous On Call to OR Lane Shope, MD       celecoxib  (CELEBREX ) capsule 200 mg  200 mg Oral On Call to OR Lane Shope, MD       Chlorhexidine  Gluconate Cloth 2 % PADS 6 each  6 each Topical Once Lane Shope, MD       gabapentin  (NEURONTIN ) capsule 300 mg  300 mg Oral On Call to OR Lane Shope, MD       indocyanine green  (IC-GREEN ) injection 1.25 mg  1.25 mg Intravenous Once Quintan Saldivar, MD        Family History Family History  Problem Relation Age of Onset   Nephrolithiasis Sister    Breast cancer Mother 68      Social History Social History   Tobacco Use   Smoking status: Former  Building services engineer status: Never Used  Substance Use Topics   Alcohol use: Yes    Alcohol/week: 1.0 standard drink of alcohol    Types: 1 Standard drinks or equivalent per week   Drug use: No        Review of Systems  All other systems reviewed and are negative.    Physical Exam Blood pressure 116/75, pulse 70, temperature (!) 97.2 F (36.2 C), temperature source Tympanic, resp. rate 18, height 5' 11 (1.803 m), weight 100.7 kg, SpO2 100%. Last Weight  Most recent update: 12/04/2023  9:16 AM    Weight  100.7 kg (222 lb)             CONSTITUTIONAL: Well developed, and nourished, appropriately responsive and aware without distress.   EYES: Sclera non-icteric.   EARS, NOSE, MOUTH AND THROAT:  The oropharynx is clear, her voice is not always. Oral mucosa is pink and moist.   Hearing is  intact to voice.  NECK: Trachea is midline, and there is no jugular venous distension.  LYMPH NODES:  Lymph nodes in the neck are not appreciated. RESPIRATORY:  Lungs are clear, and breath sounds are equal bilaterally.  Bibasilar crackles noted.  Normal respiratory effort without pathologic use of accessory muscles. CARDIOVASCULAR: Heart is regular in rate and rhythm.   Well perfused.  GI: The abdomen is tender to palpation in the right upper quadrant, otherwise soft, nontender, and nondistended. There were no palpable masses.  I did not appreciate hepatosplenomegaly. MUSCULOSKELETAL:  Symmetrical muscle tone appreciated in all four extremities.    SKIN: Skin turgor is normal. No pathologic skin lesions appreciated.  NEUROLOGIC:  Motor and sensation appear grossly normal.  Cranial  nerves are grossly without defect. PSYCH:  Alert and oriented to person, place and time. Affect is appropriate for situation.  Data Reviewed I have personally reviewed what is currently available of the patient's imaging, recent labs and medical records.   Labs:     Latest Ref Rng & Units 12/03/2023   11:59 PM 11/13/2014    5:41 AM  CBC  WBC 4.0 - 10.5 K/uL 8.9  13.4   Hemoglobin 12.0 - 15.0 g/dL 86.0  86.2   Hematocrit 36.0 - 46.0 % 42.8  41.8   Platelets 150 - 400 K/uL 265  197       Latest Ref Rng & Units 12/03/2023   11:59 PM 11/13/2014    5:41 AM  CMP  Glucose 70 - 99 mg/dL 873  837   BUN 6 - 20 mg/dL 14  21   Creatinine 9.55 - 1.00 mg/dL 8.93  8.72   Sodium 864 - 145 mmol/L 141  140   Potassium 3.5 - 5.1 mmol/L 3.9  4.2   Chloride 98 - 111 mmol/L 110  111   CO2 22 - 32 mmol/L 21  20   Calcium 8.9 - 10.3 mg/dL 9.1  9.2   Total Protein 6.5 - 8.1 g/dL 6.7  7.1   Total Bilirubin 0.0 - 1.2 mg/dL 0.7  0.6   Alkaline Phos 38 - 126 U/L 73  58   AST 15 - 41 U/L 16  18   ALT 0 - 44 U/L 12  12     Imaging: Radiological images personally reviewed:   Within last 24 hrs: US  ABDOMEN LIMITED RUQ (LIVER/GB) Result Date: 12/04/2023 EXAM: Right Upper Quadrant Abdominal Ultrasound 12/04/2023 05:26:00 AM TECHNIQUE: Real-time ultrasonography of the right upper quadrant of the abdomen was performed. COMPARISON: CT abdomen and pelvis with contrast 12/04/2023. CLINICAL HISTORY: Gallstones. FINDINGS: LIVER: The liver demonstrates somewhat echogenicity. No discrete lesions are present. No intrahepatic biliary ductal dilatation. BILIARY SYSTEM: The gallbladder is contracted. Shadowing gallstones are present at the neck of the gallbladder measuring up to 2.1 cm. No sonographic Murphy's sign is present. The common bile duct is mildly dilated at 8.4 mm. RIGHT KIDNEY: The right kidney is grossly unremarkable in appearances without evidence of hydronephrosis, echogenic calculi or worrisome mass lesions.  PANCREAS: Visualized portions of the pancreas are unremarkable. OTHER: No right upper quadrant ascites. IMPRESSION: 1. Cholelithiasis with shadowing gallstones at the neck of the gallbladder measuring up to 2.1 cm and a contracted gallbladder. No sonographic Murphy sign. 2. Mildly dilated common bile duct at 8.4 mm. Electronically signed by: Lonni Necessary MD 12/04/2023 06:33 AM EDT RP Workstation: [id number]   CT ABDOMEN PELVIS W CONTRAST Result Date: 12/04/2023 EXAM: CT ABDOMEN AND PELVIS WITH CONTRAST 12/04/2023 01:40:26 AM  TECHNIQUE: CT of the abdomen and pelvis was performed with the administration of intravenous contrast. Multiplanar reformatted images are provided for review. Automated exposure control, iterative reconstruction, and/or weight based adjustment of the mA/kV was utilized to reduce the radiation dose to as low as reasonably achievable. COMPARISON: 11/13/2014 CLINICAL HISTORY: RLQ abdominal pain. Table formatting from the original note was not included. Images from the original note were not included. Per ed notes; Pt to ED via EMS from home, pt reports she developed RLQ abd pain tonight pt reports she has also had inc in feeling gassy. Pt denies n/v/d FINDINGS: LOWER CHEST: Tiny hiatal hernia. LIVER: The liver is unremarkable. GALLBLADDER AND BILE DUCTS: Cholelithiasis, without associated inflammatory changes. No biliary ductal dilatation. SPLEEN: No acute abnormality. PANCREAS: No acute abnormality. ADRENAL GLANDS: No acute abnormality. KIDNEYS, URETERS AND BLADDER: No stones in the kidneys or ureters. No hydronephrosis. No perinephric or periureteral stranding. Urinary bladder is unremarkable. GI AND BOWEL: Stomach demonstrates no acute abnormality. There is no bowel obstruction. No bowel wall thickening. Normal appendix. PERITONEUM AND RETROPERITONEUM: No ascites. No free air. VASCULATURE: Aorta is normal in caliber. LYMPH NODES: No lymphadenopathy. REPRODUCTIVE ORGANS: Uterus is  within normal limits. BONES AND SOFT TISSUES: No acute osseous abnormality. No focal soft tissue abnormality. IMPRESSION: 1. Normal appendix. 2. Cholelithiasis, without associated inflammatory changes. 3. No acute findings in the abdomen or pelvis. Electronically signed by: Pinkie Pebbles MD 12/04/2023 01:45 AM EDT RP Workstation: [id number]    Assessment    Chronic calculus cholecystitis with biliary colic  Patient Active Problem List   Diagnosis Date Noted   Screening for colon cancer     Plan    This was discussed thoroughly.  Optimal plan is for robotic cholecystectomy utilizing ICG imaging. Risks and benefits have been discussed with the patient which include but are not limited to anesthesia, bleeding, infection, biliary ductal injury, resulting in leak or stenosis, other associated unanticipated injuries affiliated with laparoscopic surgery.   Reviewed that removing the gallbladder will only address the symptoms related to the gallbladder itself.  I believe there is the desire to proceed, accepting the risks with understanding.  Questions elicited and answered to satisfaction.    No guarantees ever expressed or implied.   CHOLECYSTECTOMY, ROBOT-ASSISTED, LAPAROSCOPIC:     I personally spent a total of 40 minutes in the care of the patient today including preparing to see the patient, getting/reviewing separately obtained history, performing a medically appropriate exam/evaluation, placing orders, and documenting clinical information in the EHR.   These notes generated with voice recognition software. I apologize for typographical errors.  Honor Leghorn M.D., FACS 12/04/2023, 9:32 AM

## 2023-12-04 NOTE — ED Provider Notes (Signed)
 [facility] Provider Note    Event Date/Time   First MD Initiated Contact with Patient 12/04/23 [phone]     (approximate)   History   Abdominal Pain   HPI  Leslie Chavez is a 58 y.o. female with no significant past medical history who presents to the emergency department with right upper quadrant abdominal pain, vomiting.  No fevers, diarrhea, chest pain or shortness of breath.  No prior abdominal surgery.  No urinary symptoms.   History provided by patient.    History reviewed. No pertinent past medical history.  Past Surgical History:  Procedure Laterality Date   BREAST BIOPSY Right    03/07/2015, neg   COLONOSCOPY WITH PROPOFOL  N/A 07/23/2019   Procedure: COLONOSCOPY WITH PROPOFOL ;  Surgeon: Unk Corinn Skiff, MD;  Location: [facility] ENDOSCOPY;  Service: Gastroenterology;  Laterality: N/A;    MEDICATIONS:  Prior to Admission medications   Medication Sig Start Date End Date Taking? Authorizing Provider  docusate sodium  (COLACE) 100 MG capsule Take 1 capsule (100 mg total) by mouth 2 (two) times daily. 11/17/14   Penne Knee, MD  medroxyPROGESTERone (DEPO-PROVERA) 150 MG/ML injection Inject 150 mg into the muscle every 3 (three) months. 08/31/14   [provider]  naproxen  (NAPROSYN ) 500 MG tablet Take 1 tablet (500 mg total) by mouth 2 (two) times daily with a meal. 07/16/21   Arlander Charleston, MD    Physical Exam   Triage Vital Signs: ED Triage Vitals  Encounter Vitals Group     BP 12/03/23 2359 107/64     Girls Systolic BP Percentile --      Girls Diastolic BP Percentile --      Boys Systolic BP Percentile --      Boys Diastolic BP Percentile --      Pulse Rate 12/03/23 2359 78     Resp 12/03/23 2359 18     Temp 12/03/23 2359 98.4 F (36.9 C)     Temp src --      SpO2 12/03/23 2359 100 %     Weight 12/03/23 2358 220 lb (99.8 kg)     Height 12/03/23 2358 5' 1 (1.549 m)     Head Circumference --      Peak Flow --      Pain  Score 12/03/23 2358 5     Pain Loc --      Pain Education --      Exclude from Growth Chart --     Most recent vital signs: Vitals:   12/03/23 2359 12/04/23 0530  BP: 107/64 110/70  Pulse: 78 70  Resp: 18 16  Temp: 98.4 F (36.9 C) 98.4 F (36.9 C)  SpO2: 100% 100%    CONSTITUTIONAL: Alert, responds appropriately to questions. Well-appearing; well-nourished HEAD: Normocephalic, atraumatic EYES: Conjunctivae clear, pupils appear equal, sclera nonicteric ENT: normal nose; moist mucous membranes NECK: Supple, normal ROM CARD: RRR; S1 and S2 appreciated RESP: Normal chest excursion without splinting or tachypnea; breath sounds clear and equal bilaterally; no wheezes, no rhonchi, no rales, no hypoxia or respiratory distress, speaking full sentences ABD/GI: Non-distended; soft, tender in the right upper quadrant without guarding or rebound, negative Murphy sign, no tenderness at McBurney's point BACK: The back appears normal EXT: Normal ROM in all joints; no deformity noted, no edema SKIN: Normal color for age and race; warm; no rash on exposed skin NEURO: Moves all extremities equally, normal speech PSYCH: The patient's mood and manner are appropriate.   ED Results /  Procedures / Treatments   LABS: (all labs ordered are listed, but only abnormal results are displayed) Labs Reviewed  COMPREHENSIVE METABOLIC PANEL WITH GFR - Abnormal; Notable for the following components:      Result Value   CO2 21 (*)    Glucose, Bld 126 (*)    Creatinine, Ser 1.06 (*)    All other components within normal limits  URINALYSIS, ROUTINE W REFLEX MICROSCOPIC - Abnormal; Notable for the following components:   Color, Urine YELLOW (*)    APPearance CLEAR (*)    Hgb urine dipstick SMALL (*)    All other components within normal limits  LIPASE, BLOOD  CBC  PREGNANCY, URINE     EKG:  RADIOLOGY: My personal review and interpretation of imaging: Gallstones.  No cholecystitis.  I have  personally reviewed all radiology reports.   US  ABDOMEN LIMITED RUQ (LIVER/GB) Result Date: 12/04/2023 EXAM: Right Upper Quadrant Abdominal Ultrasound 12/04/2023 05:26:00 AM TECHNIQUE: Real-time ultrasonography of the right upper quadrant of the abdomen was performed. COMPARISON: CT abdomen and pelvis with contrast 12/04/2023. CLINICAL HISTORY: Gallstones. FINDINGS: LIVER: The liver demonstrates somewhat echogenicity. No discrete lesions are present. No intrahepatic biliary ductal dilatation. BILIARY SYSTEM: The gallbladder is contracted. Shadowing gallstones are present at the neck of the gallbladder measuring up to 2.1 cm. No sonographic Murphy's sign is present. The common bile duct is mildly dilated at 8.4 mm. RIGHT KIDNEY: The right kidney is grossly unremarkable in appearances without evidence of hydronephrosis, echogenic calculi or worrisome mass lesions. PANCREAS: Visualized portions of the pancreas are unremarkable. OTHER: No right upper quadrant ascites. IMPRESSION: 1. Cholelithiasis with shadowing gallstones at the neck of the gallbladder measuring up to 2.1 cm and a contracted gallbladder. No sonographic Murphy sign. 2. Mildly dilated common bile duct at 8.4 mm. Electronically signed by: Lonni Necessary MD 12/04/2023 06:33 AM EDT RP Workstation: [id number]   CT ABDOMEN PELVIS W CONTRAST Result Date: 12/04/2023 EXAM: CT ABDOMEN AND PELVIS WITH CONTRAST 12/04/2023 01:40:26 AM TECHNIQUE: CT of the abdomen and pelvis was performed with the administration of intravenous contrast. Multiplanar reformatted images are provided for review. Automated exposure control, iterative reconstruction, and/or weight based adjustment of the mA/kV was utilized to reduce the radiation dose to as low as reasonably achievable. COMPARISON: 11/13/2014 CLINICAL HISTORY: RLQ abdominal pain. Table formatting from the original note was not included. Images from the original note were not included. Per ed notes; Pt to ED via  EMS from home, pt reports she developed RLQ abd pain tonight pt reports she has also had inc in feeling gassy. Pt denies n/v/d FINDINGS: LOWER CHEST: Tiny hiatal hernia. LIVER: The liver is unremarkable. GALLBLADDER AND BILE DUCTS: Cholelithiasis, without associated inflammatory changes. No biliary ductal dilatation. SPLEEN: No acute abnormality. PANCREAS: No acute abnormality. ADRENAL GLANDS: No acute abnormality. KIDNEYS, URETERS AND BLADDER: No stones in the kidneys or ureters. No hydronephrosis. No perinephric or periureteral stranding. Urinary bladder is unremarkable. GI AND BOWEL: Stomach demonstrates no acute abnormality. There is no bowel obstruction. No bowel wall thickening. Normal appendix. PERITONEUM AND RETROPERITONEUM: No ascites. No free air. VASCULATURE: Aorta is normal in caliber. LYMPH NODES: No lymphadenopathy. REPRODUCTIVE ORGANS: Uterus is within normal limits. BONES AND SOFT TISSUES: No acute osseous abnormality. No focal soft tissue abnormality. IMPRESSION: 1. Normal appendix. 2. Cholelithiasis, without associated inflammatory changes. 3. No acute findings in the abdomen or pelvis. Electronically signed by: Pinkie Pebbles MD 12/04/2023 01:45 AM EDT RP Workstation: [id number]     PROCEDURES:  Critical  Care performed: No   Procedures    IMPRESSION / MDM / ASSESSMENT AND PLAN / ED COURSE  I reviewed the triage vital signs and the nursing notes.    Patient here with right upper quad abdominal pain, vomiting.  The patient is on the cardiac monitor to evaluate for evidence of arrhythmia and/or significant heart rate changes.   DIFFERENTIAL DIAGNOSIS (includes but not limited to):   Gallstones, cholecystitis, choledocholithiasis, pancreatitis, gastritis, GERD, appendicitis   Patient's presentation is most consistent with acute presentation with potential threat to life or bodily function.   PLAN: Labs, CT of the abdomen pelvis obtained from triage and  reviewed/interpreted by myself and the radiologist.  Normal LFTs, lipase, white blood cell count.  CT scan shows gallstones but no cholecystitis.  Normal appendix.  Will proceed with right upper quad ultrasound.  Will give IV fluids, pain and nausea medicine.  Will keep NPO.   MEDICATIONS GIVEN IN ED: Medications  0.9 %  sodium chloride  infusion ( Intravenous New Bag/Given 12/04/23 0659)  iohexol  (OMNIPAQUE ) 300 MG/ML solution 100 mL (100 mLs Intravenous Contrast Given 12/04/23 0121)  morphine  (PF) 4 MG/ML injection 4 mg (4 mg Intravenous Given 12/04/23 0420)  sodium chloride  0.9 % bolus 1,000 mL (0 mLs Intravenous Stopped 12/04/23 0504)  ondansetron  (ZOFRAN ) injection 4 mg (4 mg Intravenous Given 12/04/23 0420)  ketorolac  (TORADOL ) 30 MG/ML injection 30 mg (30 mg Intravenous Given 12/04/23 0659)  morphine  (PF) 4 MG/ML injection 4 mg (4 mg Intravenous Given 12/04/23 0659)     ED COURSE: Ultrasound reviewed and interpreted by myself and the radiologist and shows gallstones.  She has a minimally elevated common bile duct as well.  There is a large stone at the gallbladder neck.  Normal LFTs.  No signs of choledocholithiasis.  I do not feel she needs an MR CP at this time given normal LFTs but I do feel she would benefit from cholecystectomy given her common bile duct is already minimally elevated with a stone lodged at the neck of the gallbladder.  She agrees with this plan.  Will discuss with general surgery.   CONSULTS: Discussed with Dr. Lane with general surgery who agrees to admit patient for surgery today.  He agrees on holding on antibiotics at this time given no signs of cholecystitis, no fever or leukocytosis.   OUTSIDE RECORDS REVIEWED: Reviewed last PCP note in February 2023.       FINAL CLINICAL IMPRESSION(S) / ED DIAGNOSES   Final diagnoses:  Gallstones     Rx / DC Orders   ED Discharge Orders     None        Note:  This document was prepared using Dragon voice  recognition software and may include unintentional dictation errors.   Porshe Fleagle, Josette SAILOR, DO 12/04/23 [phone]

## 2023-12-04 NOTE — Anesthesia Procedure Notes (Signed)
 Procedure Name: Intubation Date/Time: 12/04/2023 11:57 AM  Performed by: Norleen Alberta HERO., CRNAPre-anesthesia Checklist: Patient identified, Patient being monitored, Timeout performed, Emergency Drugs available and Suction available Patient Re-evaluated:Patient Re-evaluated prior to induction Oxygen Delivery Method: Circle system utilized Preoxygenation: Pre-oxygenation with 100% oxygen Induction Type: IV induction Ventilation: Mask ventilation without difficulty Laryngoscope Size: 3 and McGrath Grade View: Grade I Tube type: Oral Tube size: 6.5 mm Number of attempts: 1 Airway Equipment and Method: Stylet Placement Confirmation: ETT inserted through vocal cords under direct vision, positive ETCO2 and breath sounds checked- equal and bilateral Secured at: 19 cm Tube secured with: Tape Dental Injury: Teeth and Oropharynx as per pre-operative assessment

## 2023-12-04 NOTE — Interval H&P Note (Signed)
 History and Physical Interval Note:  12/04/2023 11:29 AM  Leslie Chavez  has presented today for surgery, with the diagnosis of acute cholecystitis.  The various methods of treatment have been discussed with the patient and family. After consideration of risks, benefits and other options for treatment, the patient has consented to  Procedure(s): CHOLECYSTECTOMY, ROBOT-ASSISTED, LAPAROSCOPIC (N/A) as a surgical intervention.  The patient's history has been reviewed, patient examined, no change in status, stable for surgery.  I have reviewed the patient's chart and labs.  Questions were answered to the patient's satisfaction.     Honor Leghorn

## 2023-12-04 NOTE — ED Notes (Signed)
 Pt ambulated to hallway toilet with minimal assistance from staff. Pt returned to bed, bed is in the lowest locked position with call bell within reach and pt tolerated activity well.

## 2023-12-04 NOTE — Transfer of Care (Signed)
 Immediate Anesthesia Transfer of Care Note  Patient: Leslie Chavez  Procedure(s) Performed: CHOLECYSTECTOMY, ROBOT-ASSISTED, LAPAROSCOPIC  Patient Location: PACU  Anesthesia Type:General  Level of Consciousness: awake and patient cooperative  Airway & Oxygen Therapy: Patient Spontanous Breathing  Post-op Assessment: Report given to RN and Post -op Vital signs reviewed and stable  Post vital signs: stable  Last Vitals:  Vitals Value Taken Time  BP    Temp    Pulse 88 12/04/23 13:10  Resp 23 12/04/23 13:10  SpO2 96 % 12/04/23 13:10  Vitals shown include unfiled device data.  Last Pain:  Vitals:   12/04/23 0915  TempSrc: Tympanic  PainSc: 0-No pain         Complications: No notable events documented.

## 2023-12-04 NOTE — Op Note (Signed)
 Robotic cholecystectomy with Indocyamine Green Ductal Imaging.   Pre-operative Diagnosis: Chronic calculus cholecystitis with biliary colic  Post-operative Diagnosis:  Same.  Procedure: Robotic assisted laparoscopic cholecystectomy with Indocyamine Green Ductal Imaging.   Surgeon: Honor Leghorn, M.D., FACS  Anesthesia: General. with endotracheal tube  Findings: Large stone lodged neck as expected, ICG reaching cystic duct and neck of the gallbladder, as expected due to partial obstruction.  Gallbladder was not distended, or severely edematous.  Estimated Blood Loss: 5 mL         Drains: None         Specimens: Gallbladder           Complications: none  Procedure Details  The patient was seen again in the Holding Room.  1.25 mg dose of ICG was administered intravenously.   The benefits, complications, treatment options, risks and expected outcomes were again reviewed with the patient. The likelihood of improving the patient's symptoms with return to their baseline status is good.  The patient and/or family concurred with the proposed plan, giving informed consent, again alternatives reviewed.  The patient was taken to Operating Room, identified, and the procedure verified as robotic assisted laparoscopic cholecystectomy.  Prior to the induction of general anesthesia, antibiotic prophylaxis was administered. VTE prophylaxis was in place. General endotracheal anesthesia was then administered and tolerated well. The patient was positioned in the supine position.  After the induction, the abdomen was prepped with Chloraprep and draped in the sterile fashion.  A Time Out was held and the above information confirmed.  After local infiltration of quarter percent Marcaine  with epinephrine , stab incision was made left upper quadrant.  Just below the costal margin at Palmer's point, approximately midclavicular line the Veres needle is passed with sensation of the layers to penetrate the abdominal  wall and into the peritoneum.  Saline drop test is confirmed peritoneal placement.  Insufflation is initiated with carbon dioxide to pressures of 15 mmHg.  Local infiltration with 0.25% Marcaine  with epinephrine  is utilized for all skin incisions.  Made a 12 mm incision on the right periumbilical site, I advanced an optical 11mm port under direct visualization into the peritoneal cavity.  Once the peritoneum was penetrated, insufflation was initiated.  The trocar was then advanced into the abdominal cavity under direct visualization. Pneumoperitoneum was then continued utilizing CO2 at 15 mmHg or less and tolerated well without any adverse changes in the patient's vital signs.  Two 8.5-mm ports were placed in the left lower quadrant and laterally, and one to the right lower quadrant, all under direct vision. Local infiltration with a mixture of Exparel  and 0.25% Marcaine  with epinephrine  is utilized for all port sites with deep infiltration under visualization.   The patient was positioned  in reverse Trendelenburg, tilted the patient's left side down.  Da Vinci XI robot was then positioned on to the patient's left side, and docked. I took down the adhesions of the omentum to the falciform ligament. The gallbladder was identified, the fundus grasped via the arm 4 Prograsp and retracted cephalad. Adhesions were lysed with scissors and cautery.  The infundibulum was identified grasped and retracted laterally, exposing the peritoneum overlying the triangle of Calot. This was then opened and dissected using cautery & scissors.  We did achieve the critical view of safety with 2 and only 2 structures entering the gallbladder and we were able to see the liver plate posterior to this from both sides;with the extended critical view of the cystic duct and cystic artery  obtained, aided by the ICG via FireFly which improved localization of the major ductal anatomy.    The cystic duct was clearly identified and  dissected to isolation.   Artery well isolated and clipped, and the cystic duct was triple clipped and divided with scissors, as close to the gallbladder neck as feasible, thus leaving two on the remaining stump.  The specimen side of the artery is sealed with bipolar and divided with monopolar scissors.   The gallbladder was taken from the gallbladder fossa in a retrograde fashion with the electrocautery. The gallbladder was removed and placed in an Endocatch bag.  The liver bed is inspected. Hemostasis was confirmed.  The robot was undocked and moved away from the operative field. No irrigation was utilized. The gallbladder and Endocatch sac were then removed through the paraumbilical port site.   Inspection of the right upper quadrant was performed. No bleeding, bile duct injury or leak, or bowel injury was noted. The infra-umbilical port site fascia was closed with interrumpted 0 Vicryl sutures using PMI/cone under direct visualization. Pneumoperitoneum was released and ports removed.   Extraction site was irrigated with sterile normal saline, and hemostasis confirmed.   4-0 subcuticular Monocryl was used to close the skin. Dermabond was  applied.  The patient was then extubated and brought to the recovery room in stable condition. Sponge, lap, and needle counts were correct at closure and at the conclusion of the case.               Honor Leghorn, M.D., [facility] 12/04/2023 1:31 PM

## 2023-12-08 LAB — SURGICAL PATHOLOGY

## 2023-12-10 NOTE — Anesthesia Postprocedure Evaluation (Signed)
 Anesthesia Post Note  Patient: Leslie Chavez  Procedure(s) Performed: CHOLECYSTECTOMY, ROBOT-ASSISTED, LAPAROSCOPIC  Anesthesia Type: General Anesthetic complications: no   There were no known notable events for this encounter.   Last Vitals:  Vitals:   12/04/23 1441 12/04/23 1546  BP: 130/77 (!) 117/55  Pulse: 65   Resp: 16 14  Temp: (!) 36 C (!) 36.1 C  SpO2: 97% 100%    Last Pain:  Vitals:   12/05/23 0912  TempSrc:   PainSc: 6                  Debby Mines

## 2023-12-18 ENCOUNTER — Encounter: Payer: Self-pay | Admitting: Surgery

## 2023-12-18 ENCOUNTER — Ambulatory Visit: Payer: Self-pay | Admitting: Surgery

## 2023-12-18 VITALS — BP 131/82 | HR 105 | Temp 98.3°F | Ht 71.0 in | Wt 207.4 lb

## 2023-12-18 DIAGNOSIS — Z9049 Acquired absence of other specified parts of digestive tract: Secondary | ICD-10-CM

## 2023-12-18 DIAGNOSIS — K8010 Calculus of gallbladder with chronic cholecystitis without obstruction: Secondary | ICD-10-CM

## 2023-12-18 DIAGNOSIS — Z09 Encounter for follow-up examination after completed treatment for conditions other than malignant neoplasm: Secondary | ICD-10-CM

## 2023-12-18 NOTE — Progress Notes (Signed)
 [facility] POST-OP OFFICE VISIT  12/18/2023  HPI: Leslie Chavez is a 58 y.o. female had surgery on  December 03, 2024 , now s/p Robotic cholecystectomy for acute on chronic calculus cholecystitis.   She reports some tenderness at the incisions, and some bruising at the extraction site.  She has been taking less and less hydrocodone , and only 1/day as needed, reports some sedation associated does not like it.  She is otherwise eating well having daily bowel movements.  Denies any fevers chills, diarrhea, nausea or vomiting.  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  Vital signs: BP 131/82   Pulse (!) 105   Temp 98.3 F (36.8 C) (Oral)   Ht 5' 11 (1.803 m)   Wt 207 lb 6.4 oz (94.1 kg)   LMP  (LMP Unknown)   SpO2 98%   BMI 28.93 kg/m    Physical Exam: Constitutional: Appears well, quite communicative at her baseline.  Does not want any more surgery. Abdomen: Soft benign nontender.  There is a hematoma at the extraction site, but no evidence of inflammation and no evidence of drainage.  No erythema or induration. Skin: Incisions are all intact with Dermabond intact.  Assessment/Plan: This is a 58 y.o. female who had surgery on  December 03, 2024 , now s/p Robotic cholecystectomy for acute on chronic calculus cholecystitis.    Patient Active Problem List   Diagnosis Date Noted   Calculus of gallbladder with acute cholecystitis without obstruction 12/04/2023   Screening for colon cancer     - We discussed that she can transition her pain medications to 650 mg of Tylenol  or 600 mg ibuprofen  3 times daily with food as needed for pain.  - Will be glad to have her follow-up as needed.   Honor Leghorn M.D., FACS 12/18/2023, 2:06 PM

## 2023-12-18 NOTE — Patient Instructions (Signed)

## 2023-12-19 ENCOUNTER — Encounter: Payer: Self-pay | Admitting: Surgery

## 2023-12-19 DIAGNOSIS — Z9049 Acquired absence of other specified parts of digestive tract: Secondary | ICD-10-CM | POA: Insufficient documentation

## 2023-12-22 ENCOUNTER — Ambulatory Visit
Admission: RE | Admit: 2023-12-22 | Discharge: 2023-12-22 | Disposition: A | Discharge status: Home / Self Care | Source: Ambulatory Visit | Attending: Family Medicine | Admitting: Family Medicine

## 2023-12-22 DIAGNOSIS — Z1231 Encounter for screening mammogram for malignant neoplasm of breast: Secondary | ICD-10-CM | POA: Insufficient documentation
